# Patient Record
Sex: Male | Born: 1935 | Race: White | Hispanic: No | Smoking: Never smoker
Health system: Southern US, Community
[De-identification: ages and names within clinical notes are randomized; demographics above are authoritative.]

## PROBLEM LIST (undated history)

## (undated) DIAGNOSIS — I5022 Chronic systolic (congestive) heart failure: Secondary | ICD-10-CM

## (undated) DIAGNOSIS — K219 Gastro-esophageal reflux disease without esophagitis: Secondary | ICD-10-CM

## (undated) DIAGNOSIS — E7800 Pure hypercholesterolemia, unspecified: Secondary | ICD-10-CM

## (undated) DIAGNOSIS — I10 Essential (primary) hypertension: Secondary | ICD-10-CM

## (undated) DIAGNOSIS — Z8781 Personal history of (healed) traumatic fracture: Secondary | ICD-10-CM

## (undated) DIAGNOSIS — I429 Cardiomyopathy, unspecified: Secondary | ICD-10-CM

## (undated) HISTORY — PX: NO PAST SURGERIES: SHX2092

---

## 2012-04-08 ENCOUNTER — Emergency Department (HOSPITAL_COMMUNITY)
Admission: EM | Admit: 2012-04-08 | Discharge: 2012-04-08 | Disposition: A | Discharge status: Home / Self Care | Payer: Medicare Other | Attending: Emergency Medicine | Admitting: Emergency Medicine

## 2012-04-08 ENCOUNTER — Encounter (HOSPITAL_COMMUNITY): Payer: Self-pay | Admitting: *Deleted

## 2012-04-08 ENCOUNTER — Emergency Department (HOSPITAL_COMMUNITY): Payer: Medicare Other

## 2012-04-08 DIAGNOSIS — I509 Heart failure, unspecified: Secondary | ICD-10-CM | POA: Insufficient documentation

## 2012-04-08 DIAGNOSIS — I10 Essential (primary) hypertension: Secondary | ICD-10-CM | POA: Insufficient documentation

## 2012-04-08 DIAGNOSIS — N4889 Other specified disorders of penis: Secondary | ICD-10-CM | POA: Insufficient documentation

## 2012-04-08 DIAGNOSIS — Z87891 Personal history of nicotine dependence: Secondary | ICD-10-CM | POA: Insufficient documentation

## 2012-04-08 LAB — COMPREHENSIVE METABOLIC PANEL
ALT: 36 U/L (ref 0–53)
BUN: 22 mg/dL (ref 6–23)
CO2: 27 mEq/L (ref 19–32)
Calcium: 9.1 mg/dL (ref 8.4–10.5)
Creatinine, Ser: 1.15 mg/dL (ref 0.50–1.35)
GFR calc Af Amer: 63 mL/min — ABNORMAL LOW (ref >90)
GFR calc non Af Amer: 54 mL/min — ABNORMAL LOW (ref >90)
Glucose, Bld: 159 mg/dL — ABNORMAL HIGH (ref 70–99)

## 2012-04-08 LAB — CBC WITH DIFFERENTIAL/PLATELET
Eosinophils Relative: 3 % (ref 0–5)
HCT: 38.4 % — ABNORMAL LOW (ref 39.0–52.0)
Lymphocytes Relative: 17 % (ref 12–46)
Lymphs Abs: 1.1 10*3/uL (ref 0.7–4.0)
MCV: 99.5 fL (ref 78.0–100.0)
Monocytes Absolute: 0.6 10*3/uL (ref 0.1–1.0)
Monocytes Relative: 9 % (ref 3–12)
RBC: 3.86 MIL/uL — ABNORMAL LOW (ref 4.22–5.81)
WBC: 6.6 10*3/uL (ref 4.0–10.5)

## 2012-04-08 LAB — URINALYSIS, ROUTINE W REFLEX MICROSCOPIC
Bilirubin Urine: NEGATIVE
Glucose, UA: NEGATIVE mg/dL
Specific Gravity, Urine: 1.025 (ref 1.005–1.030)

## 2012-04-08 LAB — URINE MICROSCOPIC-ADD ON

## 2012-04-08 MED ORDER — FUROSEMIDE 40 MG PO TABS: 40.0000 mg | ORAL_TABLET | Freq: Every day | ORAL | Status: DC

## 2012-04-08 NOTE — Discharge Instructions (Signed)
The swelling on your penis is related to the generalized swelling you're having from heart failure. He need to stay on a low-salt diet and you have been given a prescription for a diuretic which is going to make you urinate a lot. Followup with your PCP in one week for reevaluation. Return to the emergency department if symptoms are getting worse  Heart Failure Heart failure (HF) is a condition in which the heart has trouble pumping blood. This means your heart does not pump blood efficiently for your body to work well. In some cases of HF, fluid may back up into your lungs or you may have swelling (edema) in your lower legs. HF is a long-term (chronic) condition. It is important for you to take good care of yourself and follow your caregiver's treatment plan. CAUSES   Health conditions:   High blood pressure (hypertension) causes the heart muscle to work harder than normal. When pressure in the blood vessels is high, the heart needs to pump (contract) with more force in order to circulate blood throughout the body. High blood pressure eventually causes the heart to become stiff and weak.   Coronary artery disease (CAD) is the buildup of cholesterol and fat (plaques) in the arteries of the heart. The blockage in the arteries deprives the heart muscle of oxygen and blood. This can cause chest pain and may lead to a heart attack. High blood pressure can also contribute to CAD.   Heart attack (myocardial infarction) occurs when 1 or more arteries in the heart become blocked. The loss of oxygen damages the muscle tissue of the heart. When this happens, part of the heart muscle dies. The injured tissue does not contract as well and weakens the heart's ability to pump blood.   Abnormal heart valves can cause HF when the heart valves do not open and close properly. This makes the heart muscle pump harder to keep the blood flowing.   Heart muscle disease (cardiomyopathy or myocarditis) is damage to the heart  muscle from a variety of causes. These can include drug or alcohol abuse, infections, or unknown reasons. These can increase the risk of HF.   Lung disease makes the heart work harder because the lungs do not work properly. This can cause a strain on the heart leading it to fail.   Diabetes increases the risk of HF. High blood sugar contributes to high fat (lipid) levels in the blood. Diabetes can also cause slow damage to tiny blood vessels that carry important nutrients to the heart muscle. When the heart does not get enough oxygen and food, it can cause the heart to become weak and stiff. This leads to a heart that does not contract efficiently.   Other diseases can contribute to HF. These include abnormal heart rhythms, thyroid problems, and low blood counts (anemia).   Unhealthy lifestyle habits:   Obesity.   Smoking.   Eating foods high in fat and cholesterol.   Eating or drinking beverages high in salt.   Drug or alcohol abuse.   Lack of exercise.  SYMPTOMS  HF symptoms may vary and can be hard to detect. Symptoms may include:  Shortness of breath with activity, such as climbing stairs.   Persistent cough.   Swelling of the feet, ankles, legs, or abdomen.   Unexplained weight gain.   Difficulty breathing when lying flat.   Waking from sleep because of the need to sit up and get more air.   Rapid heartbeat.  Fatigue and loss of energy.   Feeling lightheaded or close to fainting.  DIAGNOSIS  A diagnosis of HF is based on your history, symptoms, physical examination, and diagnostic tests. Diagnostic tests for HF may include:  EKG.   Chest X-ray.   Blood tests.   Exercise stress test.   Blood oxygen test (arterial blood gas).   Evaluation by a heart doctor (cardiologist).   Ultrasound evaluation of the heart (echocardiogram).   Heart artery test to look for blockages (angiogram).   Radioactive imaging to look at the heart (radionuclide test).    TREATMENT  Treatment is aimed at managing the symptoms of HF. Medicines, lifestyle changes, or surgical intervention may be necessary to treat HF.  Medicines to help treat HF may include:   Angiotensin-converting enzyme (ACE) inhibitors. These block the effects of a blood protein called angiotensin-converting enzyme. ACE inhibitors relax (dilate) the blood vessels and help lower blood pressure. This decreases the workload of the heart, slows the progression of HF, and improves symptoms.   Angiotensin receptor blockers (ARBs). These medications work similar to ACE inhibitors. ARBs may be an alternative for people who cannot tolerate an ACE inhibitor.   Aldosterone antagonists. This medication helps get rid of extra fluid from your body. This lowers the volume of blood the heart has to pump.   Water pills (diuretics). Diuretics cause the kidneys to remove salt and water from the blood. The extra fluid is removed by urination. By removing extra fluid from the body, diuretics help lower the workload of the heart and help prevent fluid buildup in the lungs so breathing is easier.   Beta blockers. These prevent the heart from beating too fast and improve heart muscle strength. Beta blockers help maintain a normal heart rate, control blood pressure, and improve HF symptoms.   Digitalis. This increases the force of the heartbeat and may be helpful to people with HF or heart rhythm problems.   Healthy lifestyle changes include:   Stopping smoking.   Eating a healthy diet. Avoid foods high in fat. Avoid foods fried in oil or made with fat. A dietician can help with healthy food choices.   Limiting how much salt you eat.   Limiting alcohol intake to no more than 1 drink per day for women and 2 drinks per day for men. Drinking more than that is harmful to your heart. If your heart has already been damaged by alcohol or you have severe HF, drinking alcohol should be stopped completely.   Exercising  as directed by your caregiver.   Surgical treatment for HF may include:   Procedures to open blocked arteries, repair damaged heart valves, or remove damaged heart muscle tissue.   A pacemaker to help heart muscle function and to control certain abnormal heart rhythms.   A defibrillator to possibly prevent sudden cardiac death.  HOME CARE INSTRUCTIONS   Activity level. Your caregiver can help you determine what type of exercise program may be helpful. It is important to maintain your strength. Pace your physical activity to avoid shortness of breath or chest pain. Rest for 1 hour before and after meals. A cardiac rehabilitation program may be helpful to some people with HF.   Diet. Eat a heart healthy diet. Food choices should be low in saturated fat and cholesterol. Talk to a dietician to learn about heart healthy foods.   Salt intake. When you have HF, you need to limit the amount of salt you eat. Eat less than 1500  milligrams (mg) of salt per day or as recommended by your caregiver.   Weight monitoring. Weigh yourself every day. You should weigh yourself in the morning after you urinate and before you eat breakfast. Wear the same amount of clothing each time you weigh yourself. Record your weight daily. Bring your recorded weights to your clinic visits. Tell your caregiver right away if you have gained 3 lb/1.4 kg in 1 day, or 5 lb/2.3 kg in a week or whatever amount you were told to report.   Blood pressure monitoring. This should be done as directed by your caregiver. A home blood pressure cuff can be purchased at a drugstore. Record your blood pressure numbers and bring them to your clinic visits. Tell your caregiver if you become dizzy or lightheaded upon standing up.   Smoking. If you are currently a smoker, it is time to quit. Nicotine makes your heart work harder by causing your blood vessels to constrict. Do not use nicotine gum or patches before talking to your caregiver.   Follow  up. Be sure to schedule a follow-up visit with your caregiver. Keep all your appointments.  SEEK MEDICAL CARE IF:   Your weight increases by 3 lb/1.4 kg in 1 day or 5 lb/2.3 kg in a week.   You notice increasing shortness of breath that is unusual for you. This may happen during rest, sleep, or with activity.   You cough more than normal, especially with physical activity.   You notice more swelling in your hands, feet, ankles, or belly (abdomen).   You are unable to sleep because it is hard to breathe.   You cough up bloody mucus (sputum).   You begin to feel "jumping" or "fluttering" sensations (palpitations) in your chest.  SEEK IMMEDIATE MEDICAL CARE IF:   You have severe chest pain or pressure which may include symptoms such as:   Pain or pressure in the arms, neck, jaw, or back.   Feeling sweaty.   Feeling sick to your stomach (nauseous).   Feeling short of breath while at rest.   Having a fast or irregular heartbeat.   You experience stroke symptoms. These symptoms include:   Facial weakness or numbness.   Weakness or numbness in an arm, leg, or on one side of your body.   Blurred vision.   Difficulty talking or thinking.   Dizziness or fainting.   Severe headache.  THESE ARE MEDICAL EMERGENCIES. Do not wait to see if the symptoms go away. Call your local emergency services (911 in [place] DO NOT drive yourself to the hospital. IMPORTANT  Make a list of every medicine, vitamin, or herbal supplement you are taking. Keep the list with you at all times. Show it to your caregiver at every visit. Keep the list up-to-date.   Ask your caregiver or pharmacist to write an explanation of each medicine you are taking. This should include:   Why you are taking it.   The possible side effects.   The best time of day to take it.   Foods to take with it or what foods to avoid.   When to stop taking it.  MAKE SURE YOU:   Understand these instructions.   Will watch  your condition.   Will get help right away if you are not doing well or get worse.  Document Released: 09/24/2005 Document Revised: 09/13/2011 Document Reviewed: 01/06/2010 [facility] Patient Information 2012 [place], [place].  Sodium-Controlled Diet Sodium is a mineral. It is found in many  foods. Sodium may be found naturally or added during the making of a food. The most common form of sodium is salt, which is made up of sodium and chloride. Reducing your sodium intake involves changing your eating habits. The following guidelines will help you reduce the sodium in your diet:  Stop using the salt shaker.   Use salt sparingly in cooking and baking.   Substitute with sodium-free seasonings and spices.   Do not use a salt substitute (potassium chloride) without your caregiver's permission.   Include a variety of fresh, unprocessed foods in your diet.   Limit the use of processed and convenience foods that are high in sodium.  USE THE FOLLOWING FOODS SPARINGLY: Breads/Starches  Commercial bread stuffing, commercial pancake or waffle mixes, coating mixes. Waffles. Croutons. Prepared (boxed or frozen) potato, rice, or noodle mixes that contain salt or sodium. Salted [place] fries or hash browns. Salted popcorn, breads, crackers, chips, or snack foods.  Vegetables  Vegetables canned with salt or prepared in cream, butter, or cheese sauces. Sauerkraut. Tomato or vegetable juices canned with salt.   Fresh vegetables are allowed if rinsed thoroughly.  Fruit  Fruit is okay to eat.  Meat and Meat Substitutes  Salted or smoked meats, such as bacon or Canadian bacon, chipped or corned beef, hot dogs, salt pork, luncheon meats, pastrami, ham, or sausage. Canned or smoked fish, poultry, or meat. Processed cheese or cheese spreads, blue or Roquef[place] cheese. Battered or frozen fish products. Prepared spaghetti sauce. Baked beans. Reuben sandwiches. Salted nuts. Caviar.  Milk  Limit buttermilk to 1  cup per week.  Soups and Combination Foods  Bouillon cubes, canned or dried soups, broth, consomm. Convenience (frozen or packaged) dinners with more than 600 mg sodium. Pot pies, pizza, Asian food, fast food cheeseburgers, and specialty sandwiches.  Desserts and Sweets  Regular (salted) desserts, pie, commercial fruit snack pies, commercial snack cakes, canned puddings.   Eat desserts and sweets in moderation.  Fats and Oils  Gravy mixes or canned gravy. No more than 1 to 2 tbs of salad dressing. Chip dips.   Eat fats and oils in moderation.  Beverages  See those listed under the vegetables and milk groups.  Condiments  Ketchup, mustard, meat sauces, salsa, regular (salted) and lite soy sauce or mustard. Dill pickles, olives, meat tenderizer. Prepared horseradish or pickle relish. Dutch-processed cocoa. Baking powder or baking soda used medicinally. Worcestershire sauce. "Light" salt. Salt substitute, unless approved by your caregiver.  Document Released: 03/16/2002 Document Revised: 09/13/2011 Document Reviewed: 10/17/2009 [facility] Patient Information 2012 [place], [place].  Furosemide tablets What is this medicine? FUROSEMIDE (fyoor [place] se mide) is a diuretic. It helps you make more urine and to lose salt and excess water from your body. This medicine is used to treat high blood pressure, and edema or swelling from heart, kidney, or liver disease. This medicine may be used for other purposes; ask your health care provider or pharmacist if you have questions. What should I tell my health care provider before I take this medicine? They need to know if you have any of these conditions: -abnormal blood electrolytes -diarrhea or vomiting -gout -heart disease -kidney disease, small amounts of urine, or difficulty passing urine -liver disease -an unusual or allergic reaction to furosemide, sulfa drugs, other medicines, foods, dyes, or preservatives -pregnant or trying to get  pregnant -breast-feeding How should I use this medicine? Take this medicine by mouth with a glass of water. Follow the directions on the prescription  label. You may take this medicine with or without food. If it upsets your stomach, take it with food or milk. Do not take your medicine more often than directed. Remember that you will need to pass more urine after taking this medicine. Do not take your medicine at a time of day that will cause you problems. Do not take at bedtime. Talk to your pediatrician regarding the use of this medicine in children. While this drug may be prescribed for selected conditions, precautions do apply. Overdosage: If you think you have taken too much of this medicine contact a poison control center or emergency room at once. NOTE: This medicine is only for you. Do not share this medicine with others. What if I miss a dose? If you miss a dose, take it as soon as you can. If it is almost time for your next dose, take only that dose. Do not take double or extra doses. What may interact with this medicine? -aspirin and aspirin-like medicines -certain antibiotics -chloral hydrate -cisplatin -cyclosporine -digoxin -diuretics -laxatives -lithium -medicines for blood pressure -medicines that relax muscles for surgery -methotrexate -NSAIDs, medicines for pain and inflammation like ibuprofen, naproxen, or indomethacin -phenytoin -steroid medicines like prednisone or cortisone -sucralfate This list may not describe all possible interactions. Give your health care provider a list of all the medicines, herbs, non-prescription drugs, or dietary supplements you use. Also tell them if you smoke, drink alcohol, or use illegal drugs. Some items may interact with your medicine. What should I watch for while using this medicine? Visit your doctor or health care professional for regular checks on your progress. Check your blood pressure regularly. Ask your doctor or health care  professional what your blood pressure should be, and when you should contact him or her. If you are a diabetic, check your blood sugar as directed. You may need to be on a special diet while taking this medicine. Check with your doctor. Also, ask how many glasses of fluid you need to drink a day. You must not get dehydrated. You may get drowsy or dizzy. Do not drive, use machinery, or do anything that needs mental alertness until you know how this drug affects you. Do not stand or sit up quickly, especially if you are an older patient. This reduces the risk of dizzy or fainting spells. Alcohol can make you more drowsy and dizzy. Avoid alcoholic drinks. This medicine can make you more sensitive to the sun. Keep out of the sun. If you cannot avoid being in the sun, wear protective clothing and use sunscreen. Do not use sun lamps or tanning beds/booths. What side effects may I notice from receiving this medicine? Side effects that you should report to your doctor or health care professional as soon as possible: -blood in urine or stools -dry mouth -fever or chills -hearing loss or ringing in the ears -irregular heartbeat -muscle pain or weakness, cramps -skin rash -stomach upset, pain, or nausea -tingling or numbness in the hands or feet -unusually weak or tired -vomiting or diarrhea -yellowing of the eyes or skin Side effects that usually do not require medical attention (report to your doctor or health care professional if they continue or are bothersome): -headache -loss of appetite -unusual bleeding or bruising This list may not describe all possible side effects. Call your doctor for medical advice about side effects. You may report side effects to FDA at 1-800-FDA-1088. Where should I keep my medicine? Keep out of the reach of children. Store  at room temperature between 15 and 30 degrees C (59 and 86 degrees F). Protect from light. Throw away any unused medicine after the expiration  date. NOTE: This sheet is a summary. It may not cover all possible information. If you have questions about this medicine, talk to your doctor, pharmacist, or health care provider.  2012, [employer]. (09/12/2009 4:24:50 PM)

## 2012-04-08 NOTE — ED Provider Notes (Signed)
History  This chart was scribed for Dione Booze, MD by Bennett Scrape. This patient was seen in room APA08/APA08 and the patient's care was started at 9:47AM.  CSN: [id number]  Arrival date & time 04/08/12  1308   First MD Initiated Contact with Patient 04/08/12 [phone]      Chief Complaint  Patient presents with  . Groin Swelling     The history is provided by the patient. No language interpreter was used.     Calvin Rivas is a 78 y.o. male who presents to the Emergency Department complaining of groin swelling. Onset Saturday morning, with difficulty urinating due to swelling in the penis with associated increased bilateral foot swelling. Pt states that when he moves his foreskin back, he is able to urinate without difficulty. Pt is here for evaluation for fear of penile cancer. Pt has been experiencing swelling on and off since March, has been on his feet a lot, and has not been getting a lot of rest. Pt also denies nausea, vomiting, fever, chills, SOB, and orthopnea and any prior episodes of similar symptoms. He has a h/o HTN but denies having a h/o heart related problems. Pt is currently taking blood pressure medication, calcium, and a multivitamin. He is a former smoker but denies alcohol use.  PCP is Dr. Brent Bulla in [place]  Past Medical History  Diagnosis Date  . Hypertension     History reviewed. No pertinent past surgical history.  History reviewed. No pertinent family history.  History  Substance Use Topics  . Smoking status: Former Games developer  . Smokeless tobacco: Not on file  . Alcohol Use: No      Review of Systems  Constitutional: Negative for fever and chills.  Respiratory: Negative for cough and shortness of breath.   Cardiovascular: Positive for leg swelling (increased). Negative for chest pain.  Genitourinary: Positive for penile swelling. Negative for dysuria and hematuria.  All other systems reviewed and are negative.     Allergies   Penicillins  Home Medications  No current outpatient prescriptions on file.  Triage Vitals: BP 158/66  Pulse 85  Temp 97.5 F (36.4 C) (Oral)  Resp 20  Ht 5\' 7"  (1.702 m)  Wt 140 lb (63.504 kg)  BMI 21.93 kg/m2  SpO2 99%  Physical Exam  Nursing note and vitals reviewed. Constitutional: He is oriented to person, place, and time. He appears well-developed and well-nourished. No distress.  HENT:  Head: Normocephalic and atraumatic.  Eyes: EOM are normal.  Neck: Neck supple. No tracheal deviation present.  Cardiovascular: Normal rate.   Pulmonary/Chest: Effort normal. No respiratory distress. He has rales (at both lung bases).  Abdominal: Hernia confirmed negative in the right inguinal area and confirmed negative in the left inguinal area.  Genitourinary: Uncircumcised.       Edema of the foreskin, no inguinal hernia noted, urethra is normal  Musculoskeletal: Normal range of motion. He exhibits edema (2-3+ pedal and pretibial edem, 2+ presacral edema).  Neurological: He is alert and oriented to person, place, and time.  Skin: Skin is warm and dry.  Psychiatric: He has a normal mood and affect. His behavior is normal.    ED Course  Procedures (including critical care time)  DIAGNOSTIC STUDIES: Oxygen Saturation is 99% on room air, normal by my interpretation.    COORDINATION OF CARE: 9:53AM-Discussed check for CHF and obtaining a urine sample, blood test, chest x-ray, urine and EKG with pt and pt agreed to plan.  Results for orders placed  during the hospital encounter of 04/08/12  URINALYSIS, ROUTINE W REFLEX MICROSCOPIC      Component Value Range   Color, Urine YELLOW  YELLOW   APPearance CLEAR  CLEAR   Specific Gravity, Urine 1.025  1.005 - 1.030   pH 5.5  5.0 - 8.0   Glucose, UA NEGATIVE  NEGATIVE mg/dL   Hgb urine dipstick TRACE (*) NEGATIVE   Bilirubin Urine NEGATIVE  NEGATIVE   Ketones, ur NEGATIVE  NEGATIVE mg/dL   Protein, ur TRACE (*) NEGATIVE mg/dL    Urobilinogen, UA 0.2  0.0 - 1.0 mg/dL   Nitrite NEGATIVE  NEGATIVE   Leukocytes, UA NEGATIVE  NEGATIVE  CBC WITH DIFFERENTIAL      Component Value Range   WBC 6.6  4.0 - 10.5 K/uL   RBC 3.86 (*) 4.22 - 5.81 MIL/uL   Hemoglobin 12.2 (*) 13.0 - 17.0 g/dL   HCT 16.1 (*) 09.6 - 04.5 %   MCV 99.5  78.0 - 100.0 fL   MCH 31.6  26.0 - 34.0 pg   MCHC 31.8  30.0 - 36.0 g/dL   RDW 40.9  81.1 - 91.4 %   Platelets 172  150 - 400 K/uL   Neutrophils Relative 71  43 - 77 %   Neutro Abs 4.7  1.7 - 7.7 K/uL   Lymphocytes Relative 17  12 - 46 %   Lymphs Abs 1.1  0.7 - 4.0 K/uL   Monocytes Relative 9  3 - 12 %   Monocytes Absolute 0.6  0.1 - 1.0 K/uL   Eosinophils Relative 3  0 - 5 %   Eosinophils Absolute 0.2  0.0 - 0.7 K/uL   Basophils Relative 1  0 - 1 %   Basophils Absolute 0.0  0.0 - 0.1 K/uL  COMPREHENSIVE METABOLIC PANEL      Component Value Range   Sodium 140  135 - 145 mEq/L   Potassium 4.4  3.5 - 5.1 mEq/L   Chloride 103  96 - 112 mEq/L   CO2 27  19 - 32 mEq/L   Glucose, Bld 159 (*) 70 - 99 mg/dL   BUN 22  6 - 23 mg/dL   Creatinine, Ser 7.82  0.50 - 1.35 mg/dL   Calcium 9.1  8.4 - 95.6 mg/dL   Total Protein 7.2  6.0 - 8.3 g/dL   Albumin 3.3 (*) 3.5 - 5.2 g/dL   AST 45 (*) 0 - 37 U/L   ALT 36  0 - 53 U/L   Alkaline Phosphatase 135 (*) 39 - 117 U/L   Total Bilirubin 0.6  0.3 - 1.2 mg/dL   GFR calc non Af Amer 54 (*) >90 mL/min   GFR calc Af Amer 63 (*) >90 mL/min  TROPONIN I      Component Value Range   Troponin I <0.30  <0.30 ng/mL  URINE MICROSCOPIC-ADD ON      Component Value Range   RBC / HPF 0-2  <3 RBC/hpf   Dg Chest 2 View  04/08/2012  *RADIOLOGY REPORT*  Clinical Data: Edema/swelling involving the groin.  CHEST - 2 VIEW 04/08/2012:  Comparison: None.  Findings: Cardiac silhouette markedly enlarged.  Diffuse interstitial pulmonary opacities.  No localized airspace consolidation.  No pleural effusions.  Thoracic aorta atherosclerotic.  Hilar and mediastinal contours  otherwise unremarkable.  Degenerative changes involving the thoracic spine. Generalized osteopenia.  IMPRESSION: Mild CHF suspected, with marked cardiomegaly and diffuse interstitial pulmonary edema.  This may be superimposed upon interstitial fibrosis,  but I have no prior examinations for comparison.  Original Report Authenticated By: Arnell Sieving, M.D.   Date: 04/08/2012  Rate: 78  Rhythm: normal sinus rhythm  QRS Axis: normal  Intervals: normal  ST/T Wave abnormalities: nonspecific T wave changes  Conduction Disutrbances:none  Narrative Interpretation: Low voltage, nonspecific T wave flattening diffusely. No prior ECG available for comparison.  Old EKG Reviewed: none available    1. CHF (congestive heart failure)   2. Penile edema       MDM  Significant edema including edema of the penis and the foreskin. There is a localized area of increased swelling which is situated near the urethra which is apparently what is causing his problems with urinating. I reviewed his medications and he is not on any kind of diuretic. I am concerned that he has developed significant heart failure as evidenced by rales and edema. Chest x-ray and ECG will be obtained as well as laboratory workup. If no significant findings on laboratory workup, we'll he will be treated with diuretics and followup with his PCP.       I personally performed the services described in this documentation, which was scribed in my presence. The recorded information has been reviewed and considered.      Dione Booze, MD 04/08/12 1158

## 2012-04-08 NOTE — ED Notes (Addendum)
Pt c/o swelling to the end of his penis since Saturday. States that he has to pull the skin back to urinate. Pt also c/o swelling in bilateral lower extremities.

## 2012-11-15 ENCOUNTER — Encounter (HOSPITAL_COMMUNITY): Payer: Self-pay

## 2012-11-15 ENCOUNTER — Emergency Department (HOSPITAL_COMMUNITY)
Admission: EM | Admit: 2012-11-15 | Discharge: 2012-11-15 | Disposition: A | Discharge status: Home / Self Care | Payer: Medicare Other | Attending: Emergency Medicine | Admitting: Emergency Medicine

## 2012-11-15 DIAGNOSIS — Y92009 Unspecified place in unspecified non-institutional (private) residence as the place of occurrence of the external cause: Secondary | ICD-10-CM | POA: Insufficient documentation

## 2012-11-15 DIAGNOSIS — Z79899 Other long term (current) drug therapy: Secondary | ICD-10-CM | POA: Insufficient documentation

## 2012-11-15 DIAGNOSIS — S51811A Laceration without foreign body of right forearm, initial encounter: Secondary | ICD-10-CM

## 2012-11-15 DIAGNOSIS — Y9389 Activity, other specified: Secondary | ICD-10-CM | POA: Insufficient documentation

## 2012-11-15 DIAGNOSIS — I10 Essential (primary) hypertension: Secondary | ICD-10-CM | POA: Insufficient documentation

## 2012-11-15 DIAGNOSIS — S51809A Unspecified open wound of unspecified forearm, initial encounter: Secondary | ICD-10-CM | POA: Insufficient documentation

## 2012-11-15 DIAGNOSIS — Z87891 Personal history of nicotine dependence: Secondary | ICD-10-CM | POA: Insufficient documentation

## 2012-11-15 DIAGNOSIS — S0083XA Contusion of other part of head, initial encounter: Secondary | ICD-10-CM | POA: Insufficient documentation

## 2012-11-15 DIAGNOSIS — W010XXA Fall on same level from slipping, tripping and stumbling without subsequent striking against object, initial encounter: Secondary | ICD-10-CM | POA: Insufficient documentation

## 2012-11-15 DIAGNOSIS — Z7982 Long term (current) use of aspirin: Secondary | ICD-10-CM | POA: Insufficient documentation

## 2012-11-15 DIAGNOSIS — S0003XA Contusion of scalp, initial encounter: Secondary | ICD-10-CM | POA: Insufficient documentation

## 2012-11-15 NOTE — ED Notes (Signed)
Patient states he is getting hungry and tired of waiting wants to know where the MD is. Patient was informed that the MD would be in as soon as possible.

## 2012-11-15 NOTE — ED Notes (Signed)
Pt states that he got up this am around 3 to lock his backdoor, had his bedroom slippers on and one halfway came off causing him to fall, pt fell against a hutch, has skin tears to right arm, abrasions to left arm, bruising noted to left forehead area, pt states that he did hit his head against the hutch, denies any loc.

## 2012-11-15 NOTE — ED Provider Notes (Signed)
History    This chart was scribed for Carleene Cooper III, MD by Charolett Bumpers, ED Scribe. The patient was seen in room APA15/APA15. Patient's care was started at 1134.   CSN: [id number]  Arrival date & time 11/15/12  0944   First MD Initiated Contact with Patient 11/15/12 1134      Chief Complaint  Patient presents with  . Fall    The history is provided by the patient. No language interpreter was used.   Calvin Rivas is a 79 y.o. male who presents to the Emergency Department complaining of un witnessed, mechanical fall that occurred at 3 am this morning. He states that he fell this morning after tripping when his foot came out of his shoe. He states that he injured his bilateral arms and hit the left side of his head. He denies LOC or headache. He has skin tears to both arms but denies any pain or trouble with ROM. He takes an aspirin daily.   Past Medical History  Diagnosis Date  . Hypertension     History reviewed. No pertinent past surgical history.  No family history on file.  History  Substance Use Topics  . Smoking status: Former Games developer  . Smokeless tobacco: Not on file  . Alcohol Use: No      Review of Systems  Skin: Positive for wound.  Neurological: Negative for syncope and headaches.  All other systems reviewed and are negative.    Allergies  Penicillins  Home Medications   Current Outpatient Rx  Name  Route  Sig  Dispense  Refill  . aspirin 81 MG chewable tablet   Oral   Chew 81 mg by mouth daily.         . Calcium 600-200 MG-UNIT per tablet   Oral   Take 1 tablet by mouth daily.         . calcium carbonate (TUMS) 500 MG chewable tablet   Oral   Chew 1 tablet by mouth daily.         . Fe Fum-FA-B Cmp-C-Zn-Mg-Mn-Cu (HEMATINIC PLUS VIT/MINERALS) 106-1 MG TABS   Oral   Take 1 tablet by mouth daily.         Marland Kitchen losartan (COZAAR) 50 MG tablet   Oral   Take 50 mg by mouth daily.         . simvastatin (ZOCOR) 40 MG tablet  Oral   Take 40 mg by mouth every evening.         Marland Kitchen spironolactone (ALDACTONE) 25 MG tablet   Oral   Take 25 mg by mouth daily.           BP 150/63  Pulse 78  Temp(Src) 97.6 F (36.4 C) (Oral)  Resp 20  SpO2 100%  Physical Exam  Nursing note and vitals reviewed. Constitutional: He is oriented to person, place, and time. He appears well-developed and well-nourished. No distress.  HENT:  Head: Normocephalic. Head is with contusion.  Contusion to the left forehead.   Eyes: EOM are normal. Pupils are equal, round, and reactive to light.  Neck: Normal range of motion. Neck supple. No tracheal deviation present.  Cardiovascular: Normal rate, regular rhythm and normal heart sounds.   Pulmonary/Chest: Effort normal and breath sounds normal. No respiratory distress. He has no wheezes. He has no rhonchi. He has no rales.  Abdominal: Soft. He exhibits no distension.  Musculoskeletal: Normal range of motion. He exhibits no edema.  Neurological: He is alert and oriented to  person, place, and time.  Skin: Skin is warm and dry.  Skin tears to the right forearm.   Psychiatric: He has a normal mood and affect. His behavior is normal.    ED Course  LACERATION REPAIR Date/Time: 11/15/2012 1:15 PM Performed by: Osvaldo Human Authorized by: Osvaldo Human Consent: Verbal consent obtained. Risks and benefits: risks, benefits and alternatives were discussed Consent given by: patient Patient understanding: patient states understanding of the procedure being performed Site marked: the operative site was not marked Patient identity confirmed: verbally with patient Laceration length: 3 cm Foreign bodies: no foreign bodies Tendon involvement: complex Nerve involvement: complex Vascular damage: no Anesthesia method: None. Patient sedated: no Preparation: Patient was prepped and draped in the usual sterile fashion. Irrigation solution: saline Irrigation method: tap Amount of  cleaning: standard Debridement: none Degree of undermining: none Skin closure: Steri-Strips Approximation: loose Approximation difficulty: simple Patient tolerance: Patient tolerated the procedure well with no immediate complications. Comments: Wound washed, and skin tear edges approximated, and then steristripped in place.  4 x 4 and Kling dressing applied.    Pt had a second skin tear, 3 cm long.  This laceration was repaired as described above.  DIAGNOSTIC STUDIES: Oxygen Saturation is 100% on room air, normal by my interpretation.    COORDINATION OF CARE:  12:13-Discussed planned course of treatment with the patient including cleaning and treating his skin tears, who is agreeable at this time.   1:18 PM Pt to remove dressing in 2 days, and to let steristrips fall off, which should occur in about a week.     1. Skin tear of forearm without complication, right, initial encounter   2. Contusion of forehead     I personally performed the services described in this documentation, which was scribed in my presence. The recorded information has been reviewed and is accurate. Osvaldo Human, MD       Carleene Cooper III, MD 11/15/12 1344

## 2012-11-15 NOTE — ED Notes (Signed)
Pt tripped and fell, last night, injured bil arms and hit side of head. Denies loc. Skin tears to both arms.

## 2013-06-08 DEATH — deceased

## 2013-08-24 ENCOUNTER — Encounter (HOSPITAL_COMMUNITY): Payer: Self-pay | Admitting: Emergency Medicine

## 2013-08-24 ENCOUNTER — Inpatient Hospital Stay (HOSPITAL_COMMUNITY)
Admission: EM | Admit: 2013-08-24 | Discharge: 2013-08-28 | DRG: 183 | Disposition: A | Discharge status: Home / Self Care | Payer: Medicare Other | Attending: General Surgery | Admitting: General Surgery

## 2013-08-24 ENCOUNTER — Emergency Department (HOSPITAL_COMMUNITY): Payer: Medicare Other

## 2013-08-24 DIAGNOSIS — Z7982 Long term (current) use of aspirin: Secondary | ICD-10-CM

## 2013-08-24 DIAGNOSIS — E875 Hyperkalemia: Secondary | ICD-10-CM | POA: Diagnosis not present

## 2013-08-24 DIAGNOSIS — S2249XA Multiple fractures of ribs, unspecified side, initial encounter for closed fracture: Principal | ICD-10-CM | POA: Diagnosis present

## 2013-08-24 DIAGNOSIS — I129 Hypertensive chronic kidney disease with stage 1 through stage 4 chronic kidney disease, or unspecified chronic kidney disease: Secondary | ICD-10-CM | POA: Diagnosis present

## 2013-08-24 DIAGNOSIS — S2232XA Fracture of one rib, left side, initial encounter for closed fracture: Secondary | ICD-10-CM

## 2013-08-24 DIAGNOSIS — D62 Acute posthemorrhagic anemia: Secondary | ICD-10-CM | POA: Diagnosis present

## 2013-08-24 DIAGNOSIS — W19XXXA Unspecified fall, initial encounter: Secondary | ICD-10-CM

## 2013-08-24 DIAGNOSIS — E7800 Pure hypercholesterolemia, unspecified: Secondary | ICD-10-CM | POA: Diagnosis present

## 2013-08-24 DIAGNOSIS — N179 Acute kidney failure, unspecified: Secondary | ICD-10-CM | POA: Diagnosis present

## 2013-08-24 DIAGNOSIS — IMO0002 Reserved for concepts with insufficient information to code with codable children: Secondary | ICD-10-CM

## 2013-08-24 DIAGNOSIS — S066XAA Traumatic subarachnoid hemorrhage with loss of consciousness status unknown, initial encounter: Secondary | ICD-10-CM

## 2013-08-24 DIAGNOSIS — S2242XA Multiple fractures of ribs, left side, initial encounter for closed fracture: Secondary | ICD-10-CM

## 2013-08-24 DIAGNOSIS — N184 Chronic kidney disease, stage 4 (severe): Secondary | ICD-10-CM | POA: Diagnosis present

## 2013-08-24 DIAGNOSIS — I619 Nontraumatic intracerebral hemorrhage, unspecified: Secondary | ICD-10-CM | POA: Diagnosis present

## 2013-08-24 DIAGNOSIS — W100XXA Fall (on)(from) escalator, initial encounter: Secondary | ICD-10-CM | POA: Diagnosis present

## 2013-08-24 DIAGNOSIS — S069X9A Unspecified intracranial injury with loss of consciousness of unspecified duration, initial encounter: Secondary | ICD-10-CM

## 2013-08-24 DIAGNOSIS — S06369A Traumatic hemorrhage of cerebrum, unspecified, with loss of consciousness of unspecified duration, initial encounter: Secondary | ICD-10-CM

## 2013-08-24 DIAGNOSIS — Z880 Allergy status to penicillin: Secondary | ICD-10-CM

## 2013-08-24 DIAGNOSIS — E860 Dehydration: Secondary | ICD-10-CM

## 2013-08-24 DIAGNOSIS — K219 Gastro-esophageal reflux disease without esophagitis: Secondary | ICD-10-CM | POA: Diagnosis present

## 2013-08-24 DIAGNOSIS — S066X9A Traumatic subarachnoid hemorrhage with loss of consciousness of unspecified duration, initial encounter: Secondary | ICD-10-CM

## 2013-08-24 DIAGNOSIS — IMO0001 Reserved for inherently not codable concepts without codable children: Secondary | ICD-10-CM

## 2013-08-24 HISTORY — DX: Pure hypercholesterolemia, unspecified: E78.00

## 2013-08-24 LAB — BASIC METABOLIC PANEL
CO2: 27 mEq/L (ref 19–32)
Calcium: 9.4 mg/dL (ref 8.4–10.5)
GFR calc Af Amer: 46 mL/min — ABNORMAL LOW (ref >90)
GFR calc non Af Amer: 40 mL/min — ABNORMAL LOW (ref >90)
Sodium: 142 mEq/L (ref 135–145)

## 2013-08-24 LAB — CBC WITH DIFFERENTIAL/PLATELET
Basophils Absolute: 0 10*3/uL (ref 0.0–0.1)
Eosinophils Relative: 2 % (ref 0–5)
Lymphocytes Relative: 9 % — ABNORMAL LOW (ref 12–46)
MCV: 101.1 fL — ABNORMAL HIGH (ref 78.0–100.0)
Platelets: 135 10*3/uL — ABNORMAL LOW (ref 150–400)
RDW: 14.7 % (ref 11.5–15.5)
WBC: 11.2 10*3/uL — ABNORMAL HIGH (ref 4.0–10.5)

## 2013-08-24 LAB — PROTIME-INR: INR: 1.12 (ref 0.00–1.49)

## 2013-08-24 LAB — APTT: aPTT: 33 seconds (ref 24–37)

## 2013-08-24 MED ORDER — ONDANSETRON HCL 4 MG PO TABS: 4.0000 mg | ORAL_TABLET | Freq: Four times a day (QID) | ORAL | Status: DC | PRN

## 2013-08-24 MED ORDER — DOXYCYCLINE HYCLATE 100 MG PO TABS
100.0000 mg | ORAL_TABLET | Freq: Two times a day (BID) | ORAL | Status: AC
  Administered 2013-08-24 – 2013-08-28 (×8): 100 mg via ORAL
  Filled 2013-08-24 (×9): qty 1

## 2013-08-24 MED ORDER — SIMVASTATIN 40 MG PO TABS
40.0000 mg | ORAL_TABLET | Freq: Every evening | ORAL | Status: DC
  Administered 2013-08-24 – 2013-08-27 (×3): 40 mg via ORAL
  Filled 2013-08-24 (×6): qty 1

## 2013-08-24 MED ORDER — POLYETHYLENE GLYCOL 3350 17 G PO PACK
17.0000 g | PACK | Freq: Every day | ORAL | Status: DC
  Administered 2013-08-25 – 2013-08-28 (×4): 17 g via ORAL
  Filled 2013-08-24 (×5): qty 1

## 2013-08-24 MED ORDER — ONDANSETRON HCL 4 MG/2ML IJ SOLN
4.0000 mg | Freq: Four times a day (QID) | INTRAMUSCULAR | Status: DC | PRN
  Administered 2013-08-25: 4 mg via INTRAVENOUS
  Filled 2013-08-24: qty 2

## 2013-08-24 MED ORDER — POTASSIUM CHLORIDE IN NACL 20-0.45 MEQ/L-% IV SOLN
INTRAVENOUS | Status: DC
  Administered 2013-08-24 – 2013-08-25 (×2): via INTRAVENOUS
  Administered 2013-08-25: 50 mL/h via INTRAVENOUS
  Administered 2013-08-25: 50 mL via INTRAVENOUS
  Filled 2013-08-24 (×5): qty 1000

## 2013-08-24 MED ORDER — LOSARTAN POTASSIUM 50 MG PO TABS
50.0000 mg | ORAL_TABLET | Freq: Every day | ORAL | Status: DC
  Administered 2013-08-24 – 2013-08-27 (×4): 50 mg via ORAL
  Filled 2013-08-24 (×4): qty 1

## 2013-08-24 MED ORDER — DOCUSATE SODIUM 100 MG PO CAPS
100.0000 mg | ORAL_CAPSULE | Freq: Two times a day (BID) | ORAL | Status: DC
  Administered 2013-08-24 – 2013-08-28 (×8): 100 mg via ORAL
  Filled 2013-08-24 (×6): qty 1

## 2013-08-24 MED ORDER — TRAMADOL HCL 50 MG PO TABS
50.0000 mg | ORAL_TABLET | Freq: Four times a day (QID) | ORAL | Status: DC | PRN
  Administered 2013-08-24 – 2013-08-26 (×4): 50 mg via ORAL
  Filled 2013-08-24 (×4): qty 1

## 2013-08-24 MED ORDER — SPIRONOLACTONE 25 MG PO TABS
25.0000 mg | ORAL_TABLET | Freq: Every day | ORAL | Status: DC
  Administered 2013-08-24 – 2013-08-27 (×4): 25 mg via ORAL
  Filled 2013-08-24 (×4): qty 1

## 2013-08-24 MED ORDER — MORPHINE SULFATE 2 MG/ML IJ SOLN: 2.0000 mg | INTRAMUSCULAR | Status: DC | PRN

## 2013-08-24 MED ORDER — FENTANYL CITRATE 0.05 MG/ML IJ SOLN
50.0000 ug | Freq: Once | INTRAMUSCULAR | Status: AC
  Administered 2013-08-24: 50 ug via INTRAVENOUS
  Filled 2013-08-24: qty 2

## 2013-08-24 NOTE — ED Notes (Signed)
Report given to carelink, [facility]

## 2013-08-24 NOTE — ED Provider Notes (Signed)
CSN: [id number]     Arrival date & time 08/24/13  1047 History  This chart was scribed for Joya Gaskins, MD by Bennett Scrape, ED Scribe. This patient was seen in room APA09/APA09 and the patient's care was started at 11:21 AM.   Chief Complaint  Patient presents with  . Fall    Patient is a 79 y.o. male presenting with chest pain. The history is provided by the patient. No language interpreter was used.  Chest Pain Pain location:  L lateral chest (left ribs) Pain radiates to:  Does not radiate Duration: this morning. Timing:  Constant Progression:  Unchanged Chronicity:  New Worsened by:  Deep breathing and movement Associated symptoms: no abdominal pain, no back pain and no headache     HPI Comments: Calvin Rivas is a 79 y.o. male who presents to the Emergency Department complaining of rib pain with associated bilateral lower extremity swelling that started this morning. The rib pain is worse with deep breathing and movement. Pt states that he fell backwards while trying to get on an escalator 2 days ago. He hit his head and was seen at [facility] for the same after the incident. He had a negative work up including CXR. TD vaccine was updated during that visit. He denies any HA, dizziness, visual changes, back pain, neck pain or syncope. He denies any h/o CHF or other cardiac related conditions.  No LOC reported from fall.     Past Medical History  Diagnosis Date  . Hypertension   . Hypercholesterolemia   . Reflux    History reviewed. No pertinent past surgical history. No family history on file. History  Substance Use Topics  . Smoking status: Never Smoker   . Smokeless tobacco: Not on file  . Alcohol Use: No    Review of Systems  Eyes: Negative for visual disturbance.  Cardiovascular: Positive for chest pain (left rib pain).  Gastrointestinal: Negative for abdominal pain.  Musculoskeletal: Negative for back pain and neck pain.  Neurological: Negative for  syncope and headaches.  All other systems reviewed and are negative.    Allergies  Penicillins  Home Medications   Current Outpatient Rx  Name  Route  Sig  Dispense  Refill  . aspirin 81 MG chewable tablet   Oral   Chew 81 mg by mouth daily.         . Calcium 600-200 MG-UNIT per tablet   Oral   Take 1 tablet by mouth daily.         . calcium carbonate (TUMS) 500 MG chewable tablet   Oral   Chew 1 tablet by mouth daily.         . Fe Fum-FA-B Cmp-C-Zn-Mg-Mn-Cu (HEMATINIC PLUS VIT/MINERALS) 106-1 MG TABS   Oral   Take 1 tablet by mouth daily.         Marland Kitchen losartan (COZAAR) 50 MG tablet   Oral   Take 50 mg by mouth daily.         . simvastatin (ZOCOR) 40 MG tablet   Oral   Take 40 mg by mouth every evening.         Marland Kitchen spironolactone (ALDACTONE) 25 MG tablet   Oral   Take 25 mg by mouth daily.          Triage Vitals: BP 171/76  Pulse 83  Temp(Src) 98.3 F (36.8 C) (Oral)  Ht 5\' 7"  (1.702 m)  Wt 145 lb (65.772 kg)  BMI 22.71 kg/m2  SpO2  94%  Physical Exam  Nursing note and vitals reviewed.  CONSTITUTIONAL: Well developed/well nourished HEAD: Normocephalic, abrasion to the scalp, no active bleeding EYES: EOMI ENMT: Mucous membranes moist, No evidence of facial/nasal trauma NECK: supple no meningeal signs SPINE:entire spine nontender, No bruising/crepitance/stepoffs noted to spine NEXUS criteria met CV: S1/S2 noted, no murmurs/rubs/gallops noted LUNGS: crackles bilaterally, no apparent distress Chest - tender to palpation on left chest.   ABDOMEN: soft, nontender, no rebound or guarding, no bruising noted GU:no cva tenderness NEURO: Pt is awake/alert, moves all extremitiesx4, GCS 15, no focal weakness noted EXTREMITIES: pulses normal, full ROM, all extremities/joints palpated/ranged and nontender, pelvis is stable SKIN: warm, color normal PSYCH: no abnormalities of mood noted  ED Course  Procedures (including critical care time) CRITICAL  CARE Performed by: Joya Gaskins Total critical care time: 35 Critical care time was exclusive of separately billable procedures and treating other patients. Critical care was necessary to treat or prevent imminent or life-threatening deterioration. Critical care was time spent personally by me on the following activities: development of treatment plan with patient and/or surrogate as well as nursing, discussions with consultants, evaluation of patient's response to treatment, examination of patient, obtaining history from patient or surrogate, ordering and performing treatments and interventions, ordering and review of laboratory studies, ordering and review of radiographic studies, pulse oximetry and re-evaluation of patient's condition.   DIAGNOSTIC STUDIES: Oxygen Saturation is 94% on room air, adequate by my interpretation.    COORDINATION OF CARE: 11:27 AM-Discussed treatment plan which includes CXR with pt at bedside and pt agreed to plan. Pt declined pain medication.  1:13 PM Pt found to have IVH by CT imaging Also noted to left rib fractures He has no CTL tenderness No abd tenderness and no extremity trauma.  He is awake/alert, no distress Call placed to neurosurgery 1:29 PM D/w dr Danielle Dess, he reviewed Ct imaging.  He feels he can be admitted to trauma service 1:37 PM D/w dr wyatt, will accept to trauma service on stepdown Pt now requiring oxygen but he is not in distress  Labs Review Labs Reviewed  CBC WITH DIFFERENTIAL - Abnormal; Notable for the following:    WBC 11.2 (*)    RBC 3.70 (*)    Hemoglobin 12.0 (*)    HCT 37.4 (*)    MCV 101.1 (*)    Platelets 135 (*)    Neutrophils Relative % 80 (*)    Neutro Abs 9.0 (*)    Lymphocytes Relative 9 (*)    All other components within normal limits  BASIC METABOLIC PANEL - Abnormal; Notable for the following:    Glucose, Bld 134 (*)    BUN 35 (*)    Creatinine, Ser 1.49 (*)    GFR calc non Af Amer 40 (*)    GFR calc  Af Amer 46 (*)    All other components within normal limits  PROTIME-INR  APTT   Imaging Review Ct Head Wo Contrast  08/24/2013   CLINICAL DATA:  Recent fall.  EXAM: CT HEAD WITHOUT CONTRAST  TECHNIQUE: Contiguous axial images were obtained from the base of the skull through the vertex without intravenous contrast.  COMPARISON:  None.  FINDINGS: High-density hemorrhage is present in the intraventricular septum consistent with acute hemorrhage. Small amount of high-density hemorrhage in the occipital horns bilaterally.  Generalized atrophy.  Negative for acute hydrocephalus.  Chronic microvascular ischemic change in the white matter. No acute infarct or mass. Negative for skull lesion.  IMPRESSION: Intraventricular  hemorrhage, most likely due to recent trauma.  Atrophy and chronic microvascular ischemic change.  Critical Value/emergent results were called by telephone at the time of interpretation on 08/24/2013 at 12:39 PM to [facility] , who verbally acknowledged these results.   Electronically Signed   By: Marlan Palau M.D.   On: 08/24/2013 12:40   Dg Chest Portable 1 View  08/24/2013   CLINICAL DATA:  Recent fall  EXAM: PORTABLE CHEST - 1 VIEW  COMPARISON:  04/08/2012.  FINDINGS: Left 4th and 5th rib fracture suspected and not appreciated on the prior examination and therefore possibly acute. Question if the patient is tender in this region?  No gross pneumothorax.  Pulmonary edema superimposed upon chronic lung changes.  No segmental consolidation although evaluation of the left lung base is limited by the cardiomegaly.  Calcified tortuous aorta.  Thoracic spine not adequately assessed on the present exam.  IMPRESSION: Left 4th and 5th rib fracture suspected and not appreciated on the prior examination and therefore possibly acute. Question if the patient is tender in this region?  No gross pneumothorax.  Pulmonary edema superimposed upon chronic lung changes.  No segmental consolidation  although evaluation of the left lung base is limited by the cardiomegaly.  Calcified tortuous aorta.   Electronically Signed   By: Bridgett Larsson M.D.   On: 08/24/2013 11:57    EKG Interpretation    Date/Time:  Monday August 24 2013 11:34:46 EST Ventricular Rate:  79 PR Interval:  160 QRS Duration: 80 QT Interval:  422 QTC Calculation: 483 R Axis:   84 Text Interpretation:  Normal sinus rhythm Nonspecific T wave abnormality Prolonged QT Abnormal ECG When compared with ECG of 08-Apr-2012 10:09, No significant change was found            MDM  No diagnosis found. Nursing notes including past medical history and social history reviewed and considered in documentation xrays reviewed and considered Labs/vital reviewed and considered   I personally performed the services described in this documentation, which was scribed in my presence. The recorded information has been reviewed and is accurate.    Joya Gaskins, MD 08/24/13 [id number]

## 2013-08-24 NOTE — ED Notes (Signed)
Pt states he fell on escalaltor recently and was seen after the fall. Pt went to [facility] in [place]. Pt states sob and swelling to lower extremities began this morning. States ribs hurt when he breathes or with certain movement. Pt states xrays of ribs were negative for fx.

## 2013-08-24 NOTE — Progress Notes (Signed)
Patient received to room 3s bed 9 from [facility] ED via carelink. Patient settled into the bed. Vitals obtained. PA for trauma present. Waiting on further orders. Patient has been instructed to call for assistance and not get self out of bed.

## 2013-08-24 NOTE — H&P (Signed)
Calvin Rivas is an 79 y.o. male.   Chief Complaint: Fall with multiple rib fractures HPI: 79 year old male that fell down an escalator Saturday. He states he was shopping and was going up the escalator when his cane got stuck and caused him to fall and roll several times down the escalator. He fell backwards striking the back of his head and his left side. He denies LOC following the fall. He currently denies headache, blurry vision, N/V. His only current complaint is left sided rib pain, which is the reason he presented to the ED today.   Past Medical History  Diagnosis Date  . Hypertension   . Hypercholesterolemia   . Reflux     History reviewed. No pertinent past surgical history.  No family history on file. Social History:  reports that he has never smoked. He does not have any smokeless tobacco history on file. He reports that he does not drink alcohol or use illicit drugs.  Allergies:  Allergies  Allergen Reactions  . Penicillins Rash and Other (See Comments)    Pt states dr told him if he took penicillin again "it could be the last time"    Medications Prior to Admission  Medication Sig Dispense Refill  . aspirin 81 MG chewable tablet Chew 81 mg by mouth daily.      Marland Kitchen CALCIUM PO Take 1 tablet by mouth daily.      Marland Kitchen doxycycline (VIBRAMYCIN) 100 MG capsule Take 100 mg by mouth 2 (two) times daily. 10 day course starting on 08/22/2013      . Fe Fum-FA-B Cmp-C-Zn-Mg-Mn-Cu (HEMATINIC PLUS VIT/MINERALS) 106-1 MG TABS Take 1 tablet by mouth daily.      Marland Kitchen losartan (COZAAR) 50 MG tablet Take 50 mg by mouth daily.      . meloxicam (MOBIC) 15 MG tablet Take 15 mg by mouth daily.      . simvastatin (ZOCOR) 40 MG tablet Take 40 mg by mouth every evening.      Marland Kitchen spironolactone (ALDACTONE) 25 MG tablet Take 25 mg by mouth daily.      . calcium carbonate (TUMS) 500 MG chewable tablet Chew 1 tablet by mouth daily.      . Vitamin D, Ergocalciferol, (DRISDOL) 50000 UNITS CAPS capsule Take  50,000 Units by mouth every 7 (seven) days.        Results for orders placed during the hospital encounter of 08/24/13 (from the past 48 hour(s))  CBC WITH DIFFERENTIAL     Status: Abnormal   Collection Time    08/24/13 11:44 AM      Result Value Range   WBC 11.2 (*) 4.0 - 10.5 K/uL   RBC 3.70 (*) 4.22 - 5.81 MIL/uL   Hemoglobin 12.0 (*) 13.0 - 17.0 g/dL   HCT 16.1 (*) 09.6 - 04.5 %   MCV 101.1 (*) 78.0 - 100.0 fL   MCH 32.4  26.0 - 34.0 pg   MCHC 32.1  30.0 - 36.0 g/dL   RDW 40.9  81.1 - 91.4 %   Platelets 135 (*) 150 - 400 K/uL   Neutrophils Relative % 80 (*) 43 - 77 %   Neutro Abs 9.0 (*) 1.7 - 7.7 K/uL   Lymphocytes Relative 9 (*) 12 - 46 %   Lymphs Abs 1.1  0.7 - 4.0 K/uL   Monocytes Relative 8  3 - 12 %   Monocytes Absolute 0.9  0.1 - 1.0 K/uL   Eosinophils Relative 2  0 - 5 %  Eosinophils Absolute 0.2  0.0 - 0.7 K/uL   Basophils Relative 0  0 - 1 %   Basophils Absolute 0.0  0.0 - 0.1 K/uL  BASIC METABOLIC PANEL     Status: Abnormal   Collection Time    08/24/13 11:44 AM      Result Value Range   Sodium 142  135 - 145 mEq/L   Potassium 4.2  3.5 - 5.1 mEq/L   Chloride 105  96 - 112 mEq/L   CO2 27  19 - 32 mEq/L   Glucose, Bld 134 (*) 70 - 99 mg/dL   BUN 35 (*) 6 - 23 mg/dL   Creatinine, Ser 0.98 (*) 0.50 - 1.35 mg/dL   Calcium 9.4  8.4 - 11.9 mg/dL   GFR calc non Af Amer 40 (*) >90 mL/min   GFR calc Af Amer 46 (*) >90 mL/min   Comment: (NOTE)     The eGFR has been calculated using the CKD EPI equation.     This calculation has not been validated in all clinical situations.     eGFR's persistently <90 mL/min signify possible Chronic Kidney     Disease.  PROTIME-INR     Status: None   Collection Time    08/24/13 12:39 PM      Result Value Range   Prothrombin Time 14.2  11.6 - 15.2 seconds   INR 1.12  0.00 - 1.49  APTT     Status: None   Collection Time    08/24/13 12:39 PM      Result Value Range   aPTT 33  24 - 37 seconds   Ct Head Wo  Contrast  08/24/2013   CLINICAL DATA:  Recent fall.  EXAM: CT HEAD WITHOUT CONTRAST  TECHNIQUE: Contiguous axial images were obtained from the base of the skull through the vertex without intravenous contrast.  COMPARISON:  None.  FINDINGS: High-density hemorrhage is present in the intraventricular septum consistent with acute hemorrhage. Small amount of high-density hemorrhage in the occipital horns bilaterally.  Generalized atrophy.  Negative for acute hydrocephalus.  Chronic microvascular ischemic change in the white matter. No acute infarct or mass. Negative for skull lesion.  IMPRESSION: Intraventricular hemorrhage, most likely due to recent trauma.  Atrophy and chronic microvascular ischemic change.  Critical Value/emergent results were called by telephone at the time of interpretation on 08/24/2013 at 12:39 PM to [facility] , who verbally acknowledged these results.   Electronically Signed   By: Marlan Palau M.D.   On: 08/24/2013 12:40   Dg Chest Portable 1 View  08/24/2013   CLINICAL DATA:  Recent fall  EXAM: PORTABLE CHEST - 1 VIEW  COMPARISON:  04/08/2012.  FINDINGS: Left 4th and 5th rib fracture suspected and not appreciated on the prior examination and therefore possibly acute. Question if the patient is tender in this region?  No gross pneumothorax.  Pulmonary edema superimposed upon chronic lung changes.  No segmental consolidation although evaluation of the left lung base is limited by the cardiomegaly.  Calcified tortuous aorta.  Thoracic spine not adequately assessed on the present exam.  IMPRESSION: Left 4th and 5th rib fracture suspected and not appreciated on the prior examination and therefore possibly acute. Question if the patient is tender in this region?  No gross pneumothorax.  Pulmonary edema superimposed upon chronic lung changes.  No segmental consolidation although evaluation of the left lung base is limited by the cardiomegaly.  Calcified tortuous aorta.    Electronically Signed  By: Bridgett Larsson M.D.   On: 08/24/2013 11:57    Review of Systems  Constitutional: Negative for malaise/fatigue and diaphoresis.  Eyes: Negative for blurred vision and double vision.  Respiratory: Negative for shortness of breath.   Cardiovascular: Negative for chest pain.  Gastrointestinal: Negative for nausea, vomiting and abdominal pain.  Musculoskeletal: Positive for falls.       Left sided rib pain   Skin: Negative for rash.  Neurological: Negative for dizziness, loss of consciousness and headaches.  Endo/Heme/Allergies: Does not bruise/bleed easily.    Blood pressure 169/83, pulse 73, temperature 97.5 F (36.4 C), temperature source Oral, resp. rate 20, height 5\' 7"  (1.702 m), weight 141 lb 12.1 oz (64.3 kg), SpO2 99.00%. Physical Exam  Constitutional: He is oriented to person, place, and time. He appears well-developed and well-nourished.  HENT:  Head: Normocephalic.    Eyes: Pupils are equal, round, and reactive to light.  Neck: No tracheal deviation present. No thyromegaly present.  Cardiovascular: Normal rate and regular rhythm.   Respiratory: Effort normal and breath sounds normal. No respiratory distress. He has no rales.  GI: Soft. Bowel sounds are normal. There is no tenderness.  Musculoskeletal: Normal range of motion.  Neurological: He is alert and oriented to person, place, and time.  Skin: Skin is warm and dry.  Psychiatric: He has a normal mood and affect.     Assessment/Plan Fall Intraventricular hemorrhage: fall occurred Saturday morning. Head CT today showed intraventricular hemorrhage likely due to recent trauma. Due to fall occurring 2 days ago and no neuro changes or complaints, no repeat head CT necessary at this time.  Left rib fractures: Tramadol for pain every 6 hours PRN. Morphine 2 mg IV every 4 hours for break through pain only.  Anemia: patient is currently asymptomatic. Repeat CBC tomorrow Multiple medical problems:  continue home medications as tolerated/able  Blue, Olivia C 08/24/2013, 5:18 PM  The intraventricular hemorrhage appears to be a secondary finding without significant clinical consequences.  He is mainly here for rib fracture pain management.  This patient has been seen and I agree with the findings and treatment plan.  Marta Lamas. Gae Bon, MD, FACS [phone] (pager) [phone] (direct pager) Trauma Surgeon

## 2013-08-25 ENCOUNTER — Observation Stay (HOSPITAL_COMMUNITY): Payer: Medicare Other

## 2013-08-25 DIAGNOSIS — W19XXXA Unspecified fall, initial encounter: Secondary | ICD-10-CM

## 2013-08-25 DIAGNOSIS — S2242XA Multiple fractures of ribs, left side, initial encounter for closed fracture: Secondary | ICD-10-CM

## 2013-08-25 DIAGNOSIS — S066X9A Traumatic subarachnoid hemorrhage with loss of consciousness of unspecified duration, initial encounter: Secondary | ICD-10-CM

## 2013-08-25 DIAGNOSIS — N179 Acute kidney failure, unspecified: Secondary | ICD-10-CM | POA: Diagnosis present

## 2013-08-25 DIAGNOSIS — I10 Essential (primary) hypertension: Secondary | ICD-10-CM | POA: Insufficient documentation

## 2013-08-25 DIAGNOSIS — E7800 Pure hypercholesterolemia, unspecified: Secondary | ICD-10-CM | POA: Insufficient documentation

## 2013-08-25 DIAGNOSIS — D62 Acute posthemorrhagic anemia: Secondary | ICD-10-CM | POA: Diagnosis present

## 2013-08-25 DIAGNOSIS — IMO0001 Reserved for inherently not codable concepts without codable children: Secondary | ICD-10-CM

## 2013-08-25 LAB — CBC
HCT: 30.9 % — ABNORMAL LOW (ref 39.0–52.0)
MCH: 33.2 pg (ref 26.0–34.0)
MCHC: 33 g/dL (ref 30.0–36.0)
MCV: 100.7 fL — ABNORMAL HIGH (ref 78.0–100.0)
Platelets: 157 10*3/uL (ref 150–400)
RDW: 14.9 % (ref 11.5–15.5)
WBC: 8.4 10*3/uL (ref 4.0–10.5)

## 2013-08-25 LAB — BASIC METABOLIC PANEL
BUN: 36 mg/dL — ABNORMAL HIGH (ref 6–23)
Creatinine, Ser: 1.4 mg/dL — ABNORMAL HIGH (ref 0.50–1.35)
GFR calc Af Amer: 49 mL/min — ABNORMAL LOW (ref >90)
GFR calc non Af Amer: 43 mL/min — ABNORMAL LOW (ref >90)
Potassium: 5 mEq/L (ref 3.5–5.1)

## 2013-08-25 NOTE — Progress Notes (Signed)
Occupational Therapy Evaluation Patient Details Name: Calvin Rivas MRN: [id number] DOB: August 20, 1921 Today's Date: 08/25/2013 Time: 7829-5621 OT Time Calculation (min): 33 min  OT Assessment / Plan / Recommendation History of present illness Pt s/p fall off escalator 2 days ago presenting with chest/rib pain.   Clinical Impression   Niece called while Clinical research associate was in room and she states she has come down from [place] and will stay with pt 24/7 until September 13, 2013. He presents with decreased safety awareness, decreased activity tolerance, decreased mobility, increased pain, all of which impact ADL performance.    OT Assessment  Patient needs continued OT Services    Follow Up Recommendations  Home health OT;Supervision/Assistance - 24 hour (spoke with niece who will stay with pt until 09/13/13)    Barriers to Discharge      Equipment Recommendations  3 in 1 bedside comode;Tub/shower seat    Recommendations for Other Services    Frequency  Min 2X/week    Precautions / Restrictions Precautions Precautions: Fall Restrictions Weight Bearing Restrictions: No   Pertinent Vitals/Pain C/o L rib pain with movement    ADL  Eating/Feeding: Performed;Set up Where Assessed - Eating/Feeding: Chair Grooming: Performed;Wash/dry hands;Wash/dry face;Brushing hair;Set up Where Assessed - Grooming: Supported sitting Upper Body Bathing: Simulated;Minimal assistance Where Assessed - Upper Body Bathing: Supported sitting Lower Body Bathing: Moderate assistance;Simulated Where Assessed - Lower Body Bathing: Supported sit to stand Lower Body Dressing: Maximal assistance (can reach to ankles but unable to don/doff socks) Where Assessed - Lower Body Dressing: Supported sitting Toilet Transfer: Simulated;Minimal assistance Toilet Transfer Method: Sit to stand Toilet Transfer Equipment: Bedside commode Transfers/Ambulation Related to ADLs: Patient up in chair on arrival. Sit to stand with min A  and increased pain (ribs). Min A stand to sit. ADL Comments: Difficult for pt to stay focused -- needs frequent redirection. Able to reach to ankles from seated position with pain -- may benefit from AE    OT Diagnosis: Generalized weakness;Acute pain  OT Problem List: Decreased strength;Decreased range of motion;Decreased activity tolerance;Impaired balance (sitting and/or standing);Decreased safety awareness;Decreased knowledge of use of DME or AE;Pain OT Treatment Interventions: Self-care/ADL training;Therapeutic exercise;DME and/or AE instruction;Therapeutic activities;Patient/family education   OT Goals(Current goals can be found in the care plan section) Acute Rehab OT Goals Patient Stated Goal: home OT Goal Formulation: With patient Time For Goal Achievement: 09/08/13 Potential to Achieve Goals: Good  Visit Information  Last OT Received On: 08/25/13 Assistance Needed: +1 History of Present Illness: Pt s/p fall off escalator 2 days ago presenting with chest/rib pain.       Prior Functioning     Home Living Family/patient expects to be discharged to:: Private residence Living Arrangements: Alone Available Help at Discharge: Family;Available 24 hours/day (niece called -- will stay with pt until 09/13/13) Type of Home: House Home Access: Stairs to enter [employer] of Steps: 5 Entrance Stairs-Rails: Right Home Layout: One level Home Equipment: Walker - 4 wheels;Grab bars - tub/shower;Shower seat Prior Function Level of Independence: Independent Comments: pt drives, does grocery shopping and visits nusring home 1x/wk Communication Communication: No difficulties Dominant Hand: Right         Vision/Perception     Cognition  Cognition Arousal/Alertness: Awake/alert Behavior During Therapy: WFL for tasks assessed/performed Overall Cognitive Status: Within Functional Limits for tasks assessed    Extremity/Trunk Assessment Upper Extremity Assessment Upper  Extremity Assessment: Generalized weakness     Mobility       Exercise     Balance  End of Session OT - End of Session Equipment Utilized During Treatment: Gait belt Activity Tolerance: Patient limited by pain;Patient tolerated treatment well Patient left: in chair;with call bell/phone within reach;with family/visitor present Nurse Communication: Mobility status  GO Functional Limitation: Self care Self Care Current Status (O5366): At least 40 percent but less than 60 percent impaired, limited or restricted Self Care Goal Status (Y4034): At least 1 percent but less than 20 percent impaired, limited or restricted   Khaleef Ruby A 08/25/2013, 3:50 PM

## 2013-08-25 NOTE — Evaluation (Signed)
Physical Therapy Evaluation Patient Details Name: Calvin Rivas MRN: [id number] DOB: 1921-08-17 Today's Date: 08/25/2013 Time: 8295-6213 PT Time Calculation (min): 42 min  PT Assessment / Plan / Recommendation History of Present Illness  Pt s/p fall off escalator 2 days ago presenting with chest/rib pain.  Clinical Impression  Pt tolerated mobility well for first time OOB. Spoke extensively with pt regarding d/c recommendations. Pt agrees and is aware he is unsafe to return home alone due to increased falls risk and dependence on RW at this time. Pt is talking to family and friends regarding them providing 24/7 assist upon d/c so he can return home. If 24/7 assist can not be provided pt with need ST-SNF to achieve safe mod I function for safe transition home.    PT Assessment  Patient needs continued PT services    Follow Up Recommendations  Home health PT;Supervision/Assistance - 24 hour (will need SNF if pt can't find 24/7 assist)    Does the patient have the potential to tolerate intense rehabilitation      Barriers to Discharge Decreased caregiver support      Equipment Recommendations  Rolling walker with 5" wheels;3in1 (PT)    Recommendations for Other Services     Frequency Min 4X/week    Precautions / Restrictions Precautions Precautions: Fall Restrictions Weight Bearing Restrictions: No   Pertinent Vitals/Pain Chest/rib pain with mobility, pt did not rate      Mobility  Bed Mobility Bed Mobility: Supine to Sit;Sitting - Scoot to Edge of Bed Supine to Sit: 3: Mod assist;With rails;HOB elevated Sitting - Scoot to Edge of Bed: 4: Min assist;With rail Details for Bed Mobility Assistance: increased time due to L chest/rib pain, assist for trunk elevation Transfers Transfers: Sit to Stand;Stand to Sit Sit to Stand: 4: Min assist;With upper extremity assist;From bed Stand to Sit: 4: Min assist;With upper extremity assist;To chair/3-in-1 Details for Transfer  Assistance: v/c's for safe hand placement. assist to initiate transfer/anterior weight shift Ambulation/Gait Ambulation/Gait Assistance: 4: Min assist Ambulation Distance (Feet): 75 Feet Assistive device: Rolling walker Ambulation/Gait Assistance Details: max directional v/c's for walker management. pt with increased trunk flexion, v/c's to stay in walker Gait Pattern: Step-through pattern;Decreased stride length;Antalgic Gait velocity: slow General Gait Details: noted L LE limp, decreased stand time Stairs: No    Exercises     PT Diagnosis: Difficulty walking;Generalized weakness;Acute pain  PT Problem List: Decreased strength;Decreased activity tolerance;Decreased balance;Decreased mobility PT Treatment Interventions: DME instruction;Gait training;Stair training;Functional mobility training;Therapeutic activities;Therapeutic exercise     PT Goals(Current goals can be found in the care plan section) Acute Rehab PT Goals Patient Stated Goal: home PT Goal Formulation: With patient Time For Goal Achievement: 09/01/13 Potential to Achieve Goals: Good  Visit Information  Last PT Received On: 08/25/13 Assistance Needed: +1 History of Present Illness: Pt s/p fall off escalator 2 days ago presenting with chest/rib pain.       Prior Functioning  Home Living Family/patient expects to be discharged to:: Private residence Living Arrangements: Alone Available Help at Discharge: Family;Available PRN/intermittently (pt working on finding 24/7 assist) Type of Home: House Home Access: Stairs to enter Secretary/administrator of Steps: 5 Entrance Stairs-Rails: Right Home Layout: One level Home Equipment: Walker - 4 wheels Prior Function Level of Independence: Independent Comments: pt drives, does grocery shopping and visits nusring home 1x/wk Communication Communication: No difficulties Dominant Hand: Right    Cognition  Cognition Arousal/Alertness: Awake/alert Behavior During  Therapy: WFL for tasks assessed/performed Overall Cognitive Status: Within  Functional Limits for tasks assessed    Extremity/Trunk Assessment Upper Extremity Assessment Upper Extremity Assessment: Generalized weakness Lower Extremity Assessment Lower Extremity Assessment: Generalized weakness Cervical / Trunk Assessment Cervical / Trunk Assessment: Normal   Balance    End of Session PT - End of Session Equipment Utilized During Treatment: Gait belt Activity Tolerance: Patient limited by fatigue Patient left: in chair;with call bell/phone within reach Nurse Communication: Mobility status  GP Functional Assessment Tool Used: clinical judgment Functional Limitation: Mobility: Walking and moving around Mobility: Walking and Moving Around Current Status (W0981): At least 40 percent but less than 60 percent impaired, limited or restricted Mobility: Walking and Moving Around Goal Status [phone]): At least 1 percent but less than 20 percent impaired, limited or restricted   Marcene Brawn 08/25/2013, 12:30 PM  Lewis Shock, PT, DPT Pager #: [phone] Office #: [phone]

## 2013-08-25 NOTE — Progress Notes (Signed)
LOS: 1 day   Subjective: Pt feels fine, very alert today.  C/o pain in left ribs, back of scalp, and left knee.  Also notes skin tear on right hand, but this is not painful.  Pt tolerating a regular diet.  Urinating well, no BM since 08/22/13.  Objective: Vital signs in last 24 hours: Temp:  [97.5 F (36.4 C)-98.3 F (36.8 C)] 97.6 F (36.4 C) (11/18 0351) Pulse Rate:  [66-83] 66 (11/18 0351) Resp:  [13-22] 13 (11/18 0351) BP: (109-171)/(59-83) 109/59 mmHg (11/18 0351) SpO2:  [89 %-99 %] 97 % (11/18 0351) Weight:  [141 lb 12.1 oz (64.3 kg)-145 lb (65.772 kg)] 141 lb 12.1 oz (64.3 kg) (11/17 1615) Last BM Date: 08/22/13  Lab Results:  CBC  Recent Labs  08/24/13 1144 08/25/13 0515  WBC 11.2* 8.4  HGB 12.0* 10.2*  HCT 37.4* 30.9*  PLT 135* 157   BMET  Recent Labs  08/24/13 1144 08/25/13 0515  NA 142 140  K 4.2 5.0  CL 105 107  CO2 27 24  GLUCOSE 134* 99  BUN 35* 36*  CREATININE 1.49* 1.40*  CALCIUM 9.4 8.2*    Imaging: Ct Head Wo Contrast  08/24/2013   CLINICAL DATA:  Recent fall.  EXAM: CT HEAD WITHOUT CONTRAST  TECHNIQUE: Contiguous axial images were obtained from the base of the skull through the vertex without intravenous contrast.  COMPARISON:  None.  FINDINGS: High-density hemorrhage is present in the intraventricular septum consistent with acute hemorrhage. Small amount of high-density hemorrhage in the occipital horns bilaterally.  Generalized atrophy.  Negative for acute hydrocephalus.  Chronic microvascular ischemic change in the white matter. No acute infarct or mass. Negative for skull lesion.  IMPRESSION: Intraventricular hemorrhage, most likely due to recent trauma.  Atrophy and chronic microvascular ischemic change.  Critical Value/emergent results were called by telephone at the time of interpretation on 08/24/2013 at 12:39 PM to [facility] , who verbally acknowledged these results.   Electronically Signed   By: Marlan Palau M.D.   On:  08/24/2013 12:40   Dg Chest Portable 1 View  08/24/2013   CLINICAL DATA:  Recent fall  EXAM: PORTABLE CHEST - 1 VIEW  COMPARISON:  04/08/2012.  FINDINGS: Left 4th and 5th rib fracture suspected and not appreciated on the prior examination and therefore possibly acute. Question if the patient is tender in this region?  No gross pneumothorax.  Pulmonary edema superimposed upon chronic lung changes.  No segmental consolidation although evaluation of the left lung base is limited by the cardiomegaly.  Calcified tortuous aorta.  Thoracic spine not adequately assessed on the present exam.  IMPRESSION: Left 4th and 5th rib fracture suspected and not appreciated on the prior examination and therefore possibly acute. Question if the patient is tender in this region?  No gross pneumothorax.  Pulmonary edema superimposed upon chronic lung changes.  No segmental consolidation although evaluation of the left lung base is limited by the cardiomegaly.  Calcified tortuous aorta.   Electronically Signed   By: Bridgett Larsson M.D.   On: 08/24/2013 11:57     PE: General: pleasant, WD/WN white male who is laying in bed in NAD HEENT: head is normocephalic, left posterior scalp abrasion/laceration with dried sanguinous drainage.  Sclera are noninjected.  PERRL.  Ears and nose without any masses or lesions.  Mouth is pink and moist Heart: regular, rate, and rhythm.  No gallops, or rubs noted.  Palpable radial and pedal pulses bilaterally Lungs: CTAB, no wheezes,  rhonchi, or rales noted.  Respiratory effort nonlabored Abd: soft, NT/ND, +BS, no masses MS: abrasion to right outer knee, mildly tender, grossly intact ROM Skin: warm and dry with no masses, abrasion to right outer knee and skin tear on right hand Neuro/Psych: A&Ox3 with an appropriate affect   Assessment/Plan: Fall Intraventricular hemorrhage - hold off on repeat CT unless neuro changes, local wound care to scalp abrasion Left rib fx - pulm toilet Right knee  abrasion/pain - order xray ABL anemia - repeat tomorrow Tachycardia - intermittent lasted a few seconds around 120's, will continue to monitor Elevated Creatinine - Down to 1.4 today Multiple medical problems - on home meds VTE - SCD's, no dvt proph meds due to IVH FEN - reg diet, red IVF to 14mL/hour to avoid fluid overload, bowel regimen  Dispo -- PT/OT   Aris Georgia, PA-C Pager: [phone] General Trauma PA Pager: [phone]   08/25/2013

## 2013-08-25 NOTE — Progress Notes (Signed)
Episode of tachycardia resolved spontaneously. Working on pulmonary toilet. Does not have someone to stay with him 24h at D/C. Watch on 3S today. Patient examined and I agree with the assessment and plan  Violeta Gelinas, MD, MPH, FACS Pager: [phone]  08/25/2013 1:23 PM

## 2013-08-26 ENCOUNTER — Inpatient Hospital Stay (HOSPITAL_COMMUNITY): Payer: Medicare Other

## 2013-08-26 DIAGNOSIS — R0902 Hypoxemia: Secondary | ICD-10-CM

## 2013-08-26 LAB — CBC
MCH: 33.2 pg (ref 26.0–34.0)
MCHC: 32.5 g/dL (ref 30.0–36.0)
RDW: 14.7 % (ref 11.5–15.5)
WBC: 8 10*3/uL (ref 4.0–10.5)

## 2013-08-26 LAB — BASIC METABOLIC PANEL
BUN: 43 mg/dL — ABNORMAL HIGH (ref 6–23)
Calcium: 8.2 mg/dL — ABNORMAL LOW (ref 8.4–10.5)
GFR calc Af Amer: 42 mL/min — ABNORMAL LOW (ref >90)
GFR calc non Af Amer: 36 mL/min — ABNORMAL LOW (ref >90)
Potassium: 5.4 mEq/L — ABNORMAL HIGH (ref 3.5–5.1)
Sodium: 136 mEq/L (ref 135–145)

## 2013-08-26 MED ORDER — POTASSIUM CHLORIDE IN NACL 20-0.45 MEQ/L-% IV SOLN
INTRAVENOUS | Status: DC
  Administered 2013-08-26: 16:00:00 via INTRAVENOUS
  Filled 2013-08-26: qty 1000

## 2013-08-26 NOTE — Progress Notes (Signed)
Physical Therapy Treatment Patient Details Name: Calvin Rivas MRN: [id number] DOB: 1920-10-16 Today's Date: 08/26/2013 Time: 4098-1191 PT Time Calculation (min): 38 min  PT Assessment / Plan / Recommendation  History of Present Illness Pt s/p fall off escalator 2 days ago presenting with chest/rib pain.   PT Comments   Patient tolerated mobility including stair training well with report of mild fatigue and LE pain afterwards. Continues to show deficits in strength during transfers and continues to require assistance with safe use of walker during ambulation. Pt continues to be a high fall risk and requires 24/7 supervision for safe d/c. Niece was present in the room and states she is available to assist him in his home as long as needed.  Follow Up Recommendations  Home health PT;Supervision/Assistance - 24 hour     Does the patient have the potential to tolerate intense rehabilitation     Barriers to Discharge        Equipment Recommendations  Rolling walker with 5" wheels;3in1 (PT)    Recommendations for Other Services    Frequency Min 4X/week   Progress towards PT Goals Progress towards PT goals: Progressing toward goals  Plan Current plan remains appropriate    Precautions / Restrictions Precautions Precautions: Fall Restrictions Weight Bearing Restrictions: No   Pertinent Vitals/Pain SpO2 on RA 89%, dropped to 84% on RA with activity; 98% on 2L with activity, seated following activity, 92% on RA.    Mobility  Bed Mobility Bed Mobility: Not assessed Details for Bed Mobility Assistance: Pt seated in chair upon arrival. Transfers Transfers: Sit to Stand;Stand to Sit Sit to Stand: 4: Min assist;With upper extremity assist;From chair/3-in-1 Stand to Sit: 4: Min assist;To chair/3-in-1;With upper extremity assist Details for Transfer Assistance: Assist needed for anterior weight shift  Ambulation/Gait Ambulation/Gait Assistance: 4: Min assist Ambulation Distance (Feet):  75 Feet Assistive device: Rolling walker Ambulation/Gait Assistance Details: V/c to stay inside RW, pt with increased trunk flexion Gait Pattern: Step-through pattern;Decreased stride length;Trunk flexed Gait velocity: decreased General Gait Details: noted LLE limp; pt reports both LE sore and stiff; Stairs: Yes Stairs Assistance: 3: Mod assist Stairs Assistance Details (indicate cue type and reason): required modassist to correct excess weight shift to R; v/c for safe hand placement and LE sequencing Stair Management Technique: Step to pattern;One rail Right (used handheld assist throughout; R rail up, L rail down;) Number of Stairs: 3    Exercises     PT Diagnosis:    PT Problem List:   PT Treatment Interventions:     PT Goals (current goals can now be found in the care plan section) Acute Rehab PT Goals Patient Stated Goal: home  Visit Information  Last PT Received On: 08/26/13 Assistance Needed: +1 History of Present Illness: Pt s/p fall off escalator 2 days ago presenting with chest/rib pain.    Subjective Data  Patient Stated Goal: home   Cognition  Cognition Arousal/Alertness: Awake/alert Behavior During Therapy: WFL for tasks assessed/performed Overall Cognitive Status: Within Functional Limits for tasks assessed    Balance     End of Session PT - End of Session Equipment Utilized During Treatment: Gait belt Activity Tolerance: Patient limited by fatigue;Patient limited by pain Patient left: in chair;with call bell/phone within reach Nurse Communication: Mobility status   GP     Willette Pa, SPT 08/26/2013, 11:45 AM

## 2013-08-26 NOTE — Progress Notes (Signed)
Chaplain made initial visit. Patient was alert and extremely talkative. His niece, Lupita Leash, was also present. Pt wanted to share about his life experiences, his spiritual gifts, and many stories. 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 Chaplain expressed how it's not his fault for having this gift, but rather, it's how he uses it. 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 Pt seemed receptive to that. Pt continued to share about his life until a staff member came to transport him to 6N.

## 2013-08-26 NOTE — Progress Notes (Signed)
Pt transferred to 6N22; vss; pt. Alert and oriented.  Report called to receiving RN; belongings with patient.  Vivi Martens RN

## 2013-08-26 NOTE — Progress Notes (Signed)
Trauma Service Note  Subjective: Patient very talkative.  Hilarious!  Sats tend to drop when he talks a lot and does not take a deep breath.  Objective: Vital signs in last 24 hours: Temp:  [97.3 F (36.3 C)-97.8 F (36.6 C)] 97.7 F (36.5 C) (11/19 0700) Pulse Rate:  [61-67] 61 (11/19 0715) Resp:  [11-14] 12 (11/19 0715) BP: (99-120)/(50-61) 118/55 mmHg (11/19 0715) SpO2:  [98 %-100 %] 98 % (11/19 0715) Last BM Date: 08/22/13  Intake/Output from previous day: 11/18 0701 - 11/19 0700 In: 1615 [P.O.:320; I.V.:1295] Out: 325 [Urine:325] Intake/Output this shift:    General: No acute distress.  Ambulated throughout the unti yesterday.  Lungs: Clear to auscultation.  IS up to 750 only, but sats come up.  Abd: Soft, good bowel sounds. Eating okay  Extremities: No changes  Neuro: Intact  Lab Results: CBC   Recent Labs  08/25/13 0515 08/26/13 0425  WBC 8.4 8.0  HGB 10.2* 10.4*  HCT 30.9* 32.0*  PLT 157 121*   BMET  Recent Labs  08/25/13 0515 08/26/13 0425  NA 140 136  K 5.0 5.4*  CL 107 105  CO2 24 24  GLUCOSE 99 91  BUN 36* 43*  CREATININE 1.40* 1.61*  CALCIUM 8.2* 8.2*   PT/INR  Recent Labs  08/24/13 1239  LABPROT 14.2  INR 1.12   ABG No results found for this basename: PHART, PCO2, PO2, HCO3,  in the last 72 hours  Studies/Results: Dg Knee 1-2 Views Right  08/25/2013   CLINICAL DATA:  Status post fall.  EXAM: RIGHT KNEE - 1-2 VIEW  COMPARISON:  None.  FINDINGS: There is no fracture or dislocation. There is a small joint effusion. There are tricompartmental degenerative changes most severe in the medial tibial femoral compartment where there is severe joint space narrowing with a bone-on-bone appearance and marginal osteophytosis.  IMPRESSION: No acute osseous injury of the right knee.   Electronically Signed   By: Elige Ko   On: 08/25/2013 10:46   Ct Head Wo Contrast  08/24/2013   CLINICAL DATA:  Recent fall.  EXAM: CT HEAD WITHOUT  CONTRAST  TECHNIQUE: Contiguous axial images were obtained from the base of the skull through the vertex without intravenous contrast.  COMPARISON:  None.  FINDINGS: High-density hemorrhage is present in the intraventricular septum consistent with acute hemorrhage. Small amount of high-density hemorrhage in the occipital horns bilaterally.  Generalized atrophy.  Negative for acute hydrocephalus.  Chronic microvascular ischemic change in the white matter. No acute infarct or mass. Negative for skull lesion.  IMPRESSION: Intraventricular hemorrhage, most likely due to recent trauma.  Atrophy and chronic microvascular ischemic change.  Critical Value/emergent results were called by telephone at the time of interpretation on 08/24/2013 at 12:39 PM to [facility] , who verbally acknowledged these results.   Electronically Signed   By: Marlan Palau M.D.   On: 08/24/2013 12:40   Dg Chest Port 1 View  08/26/2013   CLINICAL DATA:  Subsequent encounter for pulmonary edema and multiple left rib fractures.  EXAM: PORTABLE CHEST - 1 VIEW  COMPARISON:  Portable examinations yesterday and 08/24/2013. Two-view chest x-ray 04/08/2012.  FINDINGS: Multiple left-sided rib fractures as noted previously. Cardiac silhouette markedly enlarged but stable. Diffuse interstitial and airspace pulmonary edema, unchanged. Moderate-sized left pleural effusion and/or hemothorax, unchanged, with associated dense consolidation in the left lower lobe. No new pulmonary parenchymal abnormalities.  IMPRESSION: 1. Stable moderate CHF with diffuse interstitial and airspace pulmonary edema. 2.  Stable left pleural effusion and/or hemothorax related to multiple left rib fractures. 3. Stable dense passive atelectasis and/or pneumonia in the left lower lobe. 4. No new abnormalities.   Electronically Signed   By: Hulan Saas M.D.   On: 08/26/2013 07:28   Dg Chest Port 1 View  08/25/2013   CLINICAL DATA:  Rib fractures.  EXAM: PORTABLE CHEST  - 1 VIEW  COMPARISON:  08/24/2013.  FINDINGS: Again noted are left upper posterior rib fractures. No pneumothorax. Severe cardiomegaly with pulmonary vascular prominence and diffuse interstitial prominence suggesting congestive heart failure, interstitial edema. This may be superimposed on chronic interstitial lung disease. Underlying pneumonitis cannot be excluded. Small left pleural effusion.  IMPRESSION: 1. Again noted are multiple left upper posterior rib fractures. No pneumothorax. 2. Congestive heart failure with pulmonary interstitial edema. This has not cleared from prior exam.   Electronically Signed   By: Maisie Fus  Register   On: 08/25/2013 07:48   Dg Chest Portable 1 View  08/24/2013   CLINICAL DATA:  Recent fall  EXAM: PORTABLE CHEST - 1 VIEW  COMPARISON:  04/08/2012.  FINDINGS: Left 4th and 5th rib fracture suspected and not appreciated on the prior examination and therefore possibly acute. Question if the patient is tender in this region?  No gross pneumothorax.  Pulmonary edema superimposed upon chronic lung changes.  No segmental consolidation although evaluation of the left lung base is limited by the cardiomegaly.  Calcified tortuous aorta.  Thoracic spine not adequately assessed on the present exam.  IMPRESSION: Left 4th and 5th rib fracture suspected and not appreciated on the prior examination and therefore possibly acute. Question if the patient is tender in this region?  No gross pneumothorax.  Pulmonary edema superimposed upon chronic lung changes.  No segmental consolidation although evaluation of the left lung base is limited by the cardiomegaly.  Calcified tortuous aorta.   Electronically Signed   By: Bridgett Larsson M.D.   On: 08/24/2013 11:57    Anti-infectives: Anti-infectives   Start     Dose/Rate Route Frequency Ordered Stop   08/24/13 2200  doxycycline (VIBRA-TABS) tablet 100 mg     100 mg Oral 2 times daily 08/24/13 1732 08/28/13 2159      Assessment/Plan: s/p  Advance  diet Transfer from the SDu to the floor with sat monitoring.  LOS: 2 days   Marta Lamas. Gae Bon, MD, FACS [phone] Trauma Surgeon 08/26/2013

## 2013-08-26 NOTE — Progress Notes (Signed)
Paged Dr. Lindie Spruce regarding patient's urine output.  He only had 125 cc out last night and 200 for the shift yesterday.  No new orders; encourage patient to drink more.  Will continue to monitor.  Vivi Martens RN

## 2013-08-26 NOTE — Clinical Social Work Note (Signed)
Clinical Social Work Department BRIEF PSYCHOSOCIAL ASSESSMENT 08/26/2013  Patient:  Calvin Rivas, Calvin Rivas     Account Number:  [id number]     Admit date:  08/24/2013  Clinical Social Worker:  Verl Blalock  Date/Time:  08/26/2013 02:30 PM  Referred by:  Physician  Date Referred:  08/26/2013 Referred for  Psychosocial assessment   Other Referral:   PT recommending 24 hour care or SNF placement   Interview type:  Patient Other interview type:   Patient neice at bedside - patient goes by Calvin Rivas    PSYCHOSOCIAL DATA Living Status:  ALONE Admitted from facility:   Level of care:   Primary support name:  Calvin Rivas  [phone] Primary support relationship to patient:  FAMILY Degree of support available:   Strong    CURRENT CONCERNS Current Concerns  None Noted   Other Concerns:    SOCIAL WORK ASSESSMENT / PLAN Clinical Social Worker met with patient and patient neice at bedside to offer support and discuss patient needs at discharge.  Patient states that he lives at home alone and has several friends coming by to check in on him.  Patient is very involved in the community and is hopeful to get back home and continue his community involvement.  Patient has made arrangements to stay with his neice in [place] who can provide 24 hour support until patient is independent enough to return home.  Patient neice is in full agreement with this plan.  Patient adamantly continues to refuse SNF placement.    CSW notified CM of patient plans and information to make arrangements for home health or outpatient therapies.  CSW to complete SBIRT at another time - patient transferring to new unit.  CSW remains available for support and to assist with discharge needs as needed.   Assessment/plan status:  Psychosocial Support/Ongoing Assessment of Needs Other assessment/ plan:   Information/referral to community resources:   Visual merchandiser received demographic information for patient plan  at discharge.  Additional resources will be provided as needed.    PATIENT'S/FAMILY'S RESPONSE TO PLAN OF CARE: Patient alert and oriented x3 sitting up in the chair. Patient in good spirits and very willing to engage in assessment process.  Patient with good relationship with his neice who plans to provide 24 hour support and assistance.  Patient is hopeful for return home alone at some point soon.  Patient understanding of CSW role and appreciative of support and understanding.

## 2013-08-27 DIAGNOSIS — N179 Acute kidney failure, unspecified: Secondary | ICD-10-CM

## 2013-08-27 LAB — BASIC METABOLIC PANEL
Chloride: 103 mEq/L (ref 96–112)
Creatinine, Ser: 1.69 mg/dL — ABNORMAL HIGH (ref 0.50–1.35)
GFR calc Af Amer: 39 mL/min — ABNORMAL LOW (ref >90)
GFR calc non Af Amer: 34 mL/min — ABNORMAL LOW (ref >90)
Potassium: 5.9 mEq/L — ABNORMAL HIGH (ref 3.5–5.1)

## 2013-08-27 LAB — URINALYSIS, ROUTINE W REFLEX MICROSCOPIC
Glucose, UA: NEGATIVE mg/dL
Ketones, ur: NEGATIVE mg/dL
Leukocytes, UA: NEGATIVE
Nitrite: NEGATIVE
Urobilinogen, UA: 0.2 mg/dL (ref 0.0–1.0)
pH: 5 (ref 5.0–8.0)

## 2013-08-27 MED ORDER — FUROSEMIDE 80 MG PO TABS
80.0000 mg | ORAL_TABLET | Freq: Two times a day (BID) | ORAL | Status: DC
  Administered 2013-08-27 – 2013-08-28 (×2): 80 mg via ORAL
  Filled 2013-08-27 (×4): qty 1

## 2013-08-27 MED ORDER — SODIUM POLYSTYRENE SULFONATE 15 GM/60ML PO SUSP
30.0000 g | Freq: Once | ORAL | Status: AC
  Administered 2013-08-27: 30 g via ORAL
  Filled 2013-08-27: qty 120

## 2013-08-27 NOTE — Progress Notes (Signed)
UR completed.  Pt to d/c home to [facility], [place] with his niece to provide 24hr care. She explained that in the past when there was a HH need for her mother, they could not get anyone to come out that far to provide services.  She stated she was happy to take patient for outpatient therapy if we could help her locate one. They will be in general vicinity of [place], [place], [place].  Address for d/c is [address] [place], [place] [address]. Phone is [phone]. Niece is Murvin Natal.  Carlyle Lipa, RN BSN MHA CCM Trauma/Neuro ICU Case Manager [phone]

## 2013-08-27 NOTE — Progress Notes (Signed)
Renal eval for non-oliguric AKI - mild. I spoke at length with his niece as well,  Patient examined and I agree with the assessment and plan  Violeta Gelinas, MD, MPH, FACS Pager: [phone]  08/27/2013 11:54 AM

## 2013-08-27 NOTE — Consult Note (Signed)
Calvin Rivas is an 79 y.o. male referred by Dr Janee Morn   Chief Complaint: CKD 4, hyperkalemia HPI: 79yo white male admitted 08/24/13 following a fall down an escalator and sustaining intraventricular bleed and left rib fx.  Sct 1.49 on admission and now 1.69. UO poor (or poorly recorded).   Pt lives in [place] and denies any hx of CKD though he is not the greatest historian. Unable to give me correct name of his PCP.  He has been on losartan, spironolactone and receiving IV fluids with KCL.  Past Medical History  Diagnosis Date  . Hypertension   . Hypercholesterolemia   . Reflux     History reviewed. No pertinent past surgical history.  No family history on file.  Neg for renal disease Social History:  reports that he has never smoked. He does not have any smokeless tobacco history on file. He reports that he does not drink alcohol or use illicit drugs. Lives in [place] by himself.  No children.  Allergies:  Allergies  Allergen Reactions  . Penicillins Rash and Other (See Comments)    Pt states dr told him if he took penicillin again "it could be the last time"    Medications Prior to Admission  Medication Sig Dispense Refill  . aspirin 81 MG chewable tablet Chew 81 mg by mouth daily.      Marland Kitchen CALCIUM PO Take 1 tablet by mouth daily.      Marland Kitchen doxycycline (VIBRAMYCIN) 100 MG capsule Take 100 mg by mouth 2 (two) times daily. 10 day course starting on 08/22/2013      . Fe Fum-FA-B Cmp-C-Zn-Mg-Mn-Cu (HEMATINIC PLUS VIT/MINERALS) 106-1 MG TABS Take 1 tablet by mouth daily.      Marland Kitchen losartan (COZAAR) 50 MG tablet Take 50 mg by mouth daily.      . meloxicam (MOBIC) 15 MG tablet Take 15 mg by mouth daily.      . simvastatin (ZOCOR) 40 MG tablet Take 40 mg by mouth every evening.      Marland Kitchen spironolactone (ALDACTONE) 25 MG tablet Take 25 mg by mouth daily.      . calcium carbonate (TUMS) 500 MG chewable tablet Chew 1 tablet by mouth daily.      . Vitamin D, Ergocalciferol, (DRISDOL) 50000 UNITS  CAPS capsule Take 50,000 Units by mouth every 7 (seven) days.         Lab Results: UA: ND   Recent Labs  08/25/13 0515 08/26/13 0425  WBC 8.4 8.0  HGB 10.2* 10.4*  HCT 30.9* 32.0*  PLT 157 121*   BMET  Recent Labs  08/25/13 0515 08/26/13 0425 08/27/13 0836  NA 140 136 136  K 5.0 5.4* 5.9*  CL 107 105 103  CO2 24 24 25   GLUCOSE 99 91 123*  BUN 36* 43* 47*  CREATININE 1.40* 1.61* 1.69*  CALCIUM 8.2* 8.2* 8.7   LFT No results found for this basename: PROT, ALBUMIN, AST, ALT, ALKPHOS, BILITOT, BILIDIR, IBILI,  in the last 72 hours Dg Chest Port 1 View  08/26/2013   CLINICAL DATA:  Subsequent encounter for pulmonary edema and multiple left rib fractures.  EXAM: PORTABLE CHEST - 1 VIEW  COMPARISON:  Portable examinations yesterday and 08/24/2013. Two-view chest x-ray 04/08/2012.  FINDINGS: Multiple left-sided rib fractures as noted previously. Cardiac silhouette markedly enlarged but stable. Diffuse interstitial and airspace pulmonary edema, unchanged. Moderate-sized left pleural effusion and/or hemothorax, unchanged, with associated dense consolidation in the left lower lobe. No new pulmonary parenchymal abnormalities.  IMPRESSION: 1. Stable moderate CHF with diffuse interstitial and airspace pulmonary edema. 2. Stable left pleural effusion and/or hemothorax related to multiple left rib fractures. 3. Stable dense passive atelectasis and/or pneumonia in the left lower lobe. 4. No new abnormalities.   Electronically Signed   By: Hulan Saas M.D.   On: 08/26/2013 07:28    ROS: No change in vision + pain lt chest No SOB No ABD pain Chronic pain Rt knee No neuropathic Sxs No dysuria  PHYSICAL EXAM: Blood pressure 146/63, pulse 93, temperature 97.7 F (36.5 C), temperature source Oral, resp. rate 20, height 5\' 7"  (1.702 m), weight 65.545 kg (144 lb 8 oz), SpO2 96.00%. HEENT: PERRLA EOMI NECK:mild JVD LUNGS:Bil crackles CARDIAC:RRR with S4 gallop ABD:+ BS NT.  Mild  suprapubic fullness EXT:tr-1+ edema NEURO:CNI OX3 M&SI no asterixis  Assessment: 1. Presumably CKD 3 that I suspect would most likely be related to nephrosclerosis from HTN and AGE.  He has some suprapubic fullness which raises the ? of urinary retention 2. HTN 3. Hyperkalemia sec CKD.ARB,spironolactone, and IV KCL 4. Intravent bleed 5. Lt rib fx PLAN: 1. Place foley 2. PO lasix 3. PO kayexalate 4. DC losartan and spironolactone.  I would never put him back on ACE or ARB 5. Renal diet for now 6. Renal US 7. Daily Scr and K 8. UA and SPEP   Daichi Moris T 08/27/2013, 3:36 PM

## 2013-08-27 NOTE — Progress Notes (Signed)
Physical Therapy Treatment Patient Details Name: Sabastien Rivas MRN: [id number] DOB: 1921-06-10 Today's Date: 08/27/2013 Time: 4401-0272 PT Time Calculation (min): 30 min  PT Assessment / Plan / Recommendation  History of Present Illness Pt s/p fall off escalator 2 days ago presenting with chest/rib pain.   PT Comments   Focus of treatment on family education with niece present and assisting patient throughout.  States railing on steps at house pt will stay to be installed Saturday so demonstrated how to negotiate with walker with reverse technique.  Will need HHPT versus outpatient PT with niece exploring options in the area.  Follow Up Recommendations  Home health PT;Supervision/Assistance - 24 hour;Outpatient PT (depending on what is available in area, niece checking)           Equipment Recommendations  Rolling walker with 5" wheels;3in1 (PT)       Frequency Min 4X/week   Progress towards PT Goals Progress towards PT goals: Progressing toward goals  Plan Current plan remains appropriate    Precautions / Restrictions Precautions Precautions: Fall   Pertinent Vitals/Pain C/o rib pain with transfers and left LE pain with ambulation; not rated    Mobility  Bed Mobility Bed Mobility: Not assessed Details for Bed Mobility Assistance: Pt seated in chair upon arrival. Transfers Sit to Stand: 4: Min assist;With upper extremity assist;From chair/3-in-1 Stand to Sit: 4: Min assist;To chair/3-in-1;With upper extremity assist Details for Transfer Assistance: cues for scooting out and back and assist for lifting up to stand (increased rib pain with pushing up to stand) Ambulation/Gait Ambulation/Gait Assistance: 4: Min assist Ambulation Distance (Feet): 60 Feet Assistive device: Rolling walker Ambulation/Gait Assistance Details: c/o left LE pain with ambulation; neice present and educated how to assist pt and she performed assistance with pt during ambulation Gait Pattern: Trunk  flexed;Trunk rotated posteriorly on left;Step-to pattern;Decreased stride length;Antalgic;Shuffle Stairs Assistance: 1: +2 Total assist Stairs Assistance Details (indicate cue type and reason): mod assist for stepping up backwards and neice holding walker education with neice on sequence and technique to assist pt; pt with posterior loss of balance at times needing up to 50% assist Stair Management Technique: Step to pattern;Backwards;With walker      PT Goals (current goals can now be found in the care plan section)    Visit Information  Last PT Received On: 08/27/13 Assistance Needed: +1 History of Present Illness: Pt s/p fall off escalator 2 days ago presenting with chest/rib pain.    Subjective Data      Cognition  Cognition Arousal/Alertness: Awake/alert Behavior During Therapy: WFL for tasks assessed/performed Overall Cognitive Status: Within Functional Limits for tasks assessed    Balance     End of Session PT - End of Session Equipment Utilized During Treatment: Gait belt Activity Tolerance: Patient limited by fatigue;Patient limited by pain Patient left: in chair;with call bell/phone within reach   GP     [facility] 08/27/2013, 4:28 PM Calvin Rivas, Calvin Rivas [phone] 08/27/2013

## 2013-08-27 NOTE — Progress Notes (Signed)
Patient ID: Calvin Rivas, male   DOB: Nov 15, 1920, 79 y.o.   MRN: [id number]   LOS: 3 days   Subjective: No new c/o.   Objective: Vital signs in last 24 hours: Temp:  [97.1 F (36.2 C)-98.1 F (36.7 C)] 97.8 F (36.6 C) (11/20 0607) Pulse Rate:  [65-93] 89 (11/20 0607) Resp:  [12-20] 20 (11/20 0607) BP: (102-123)/(60-62) 123/61 mmHg (11/20 0607) SpO2:  [91 %-98 %] 98 % (11/20 0607) Weight:  [144 lb 8 oz (65.545 kg)] 144 lb 8 oz (65.545 kg) (11/19 1518) Last BM Date: 08/26/13   IS:   Laboratory  BMET  Recent Labs  08/25/13 0515 08/26/13 0425  NA 140 136  K 5.0 5.4*  CL 107 105  CO2 24 24  GLUCOSE 99 91  BUN 36* 43*  CREATININE 1.40* 1.61*  CALCIUM 8.2* 8.2*    Physical Exam General appearance: alert and no distress Resp: clear to auscultation bilaterally Cardio: regular rate and rhythm GI: normal findings: bowel sounds normal and soft, non-tender   Assessment/Plan: Fall  Intraventricular hemorrhage - hold off on repeat CT unless neuro changes, local wound care to scalp abrasion  Left rib fx - pulm toilet  ABL anemia - Stable CKD -- Check today Multiple medical problems - on home meds  FEN - reg diet, red IVF to 49mL/hour to avoid fluid overload, bowel regimen  VTE - SCD's Dispo -- Home with niece unless renal fxn has not stabilized, then renal consult    Freeman Caldron, PA-C Pager: [phone] General Trauma PA Pager: [phone]   08/27/2013

## 2013-08-28 ENCOUNTER — Inpatient Hospital Stay (HOSPITAL_COMMUNITY): Payer: Medicare Other

## 2013-08-28 LAB — RENAL FUNCTION PANEL
Albumin: 3 g/dL — ABNORMAL LOW (ref 3.5–5.2)
BUN: 40 mg/dL — ABNORMAL HIGH (ref 6–23)
CO2: 28 mEq/L (ref 19–32)
Calcium: 8.9 mg/dL (ref 8.4–10.5)
Chloride: 104 mEq/L (ref 96–112)
GFR calc Af Amer: 47 mL/min — ABNORMAL LOW (ref >90)
GFR calc non Af Amer: 41 mL/min — ABNORMAL LOW (ref >90)
Phosphorus: 3.2 mg/dL (ref 2.3–4.6)
Sodium: 140 mEq/L (ref 135–145)

## 2013-08-28 LAB — IRON AND TIBC
TIBC: 223 ug/dL (ref 215–435)
UIBC: 200 ug/dL (ref 125–400)

## 2013-08-28 MED ORDER — TRAMADOL HCL 50 MG PO TABS: 50.0000 mg | ORAL_TABLET | Freq: Four times a day (QID) | ORAL | Status: DC | PRN

## 2013-08-28 MED ORDER — SIMVASTATIN 40 MG PO TABS: 40.0000 mg | ORAL_TABLET | Freq: Every evening | ORAL | Status: AC

## 2013-08-28 MED ORDER — DOXYCYCLINE HYCLATE 100 MG PO CAPS: 100.0000 mg | ORAL_CAPSULE | Freq: Two times a day (BID) | ORAL | Status: DC

## 2013-08-28 NOTE — Progress Notes (Signed)
Pt with DME orders for rolling walker and 3-in-1 BSC. Spoke with pt who says that he already has a Rollator that Medicare paid for, but he does wish to have the [facility]. It was delivered to room by [facility] while I was speaking with pt. Outpt therapy locations written down and placed in drawer with pt's discharge instructions and RN updated on plan.

## 2013-08-28 NOTE — Discharge Summary (Signed)
Patient looks great and should be fine at home.  This patient has been seen and I agree with the findings and treatment plan.  Marta Lamas. Gae Bon, MD, FACS [phone] (pager) [phone] (direct pager) Trauma Surgeon

## 2013-08-28 NOTE — Progress Notes (Signed)
Subjective: Interval History: has complaints not getting nursing service.  Objective: Vital signs in last 24 hours: Temp:  [97.2 F (36.2 C)-98.2 F (36.8 C)] 98.2 F (36.8 C) (11/21 0535) Pulse Rate:  [86-93] 86 (11/21 0535) Resp:  [16-20] 18 (11/21 0535) BP: (109-149)/(52-71) 109/52 mmHg (11/21 0535) SpO2:  [95 %-97 %] 95 % (11/21 0535) Weight change:   Intake/Output from previous day: 11/20 0701 - 11/21 0700 In: 240 [P.O.:240] Out: 3675 [Urine:3675] Intake/Output this shift:    General appearance: alert, cooperative and pale Resp: diminished breath sounds bilaterally, rales bibasilar and poor expansion Cardio: regular rate and rhythm and systolic murmur: systolic ejection 2/6, decrescendo at 2nd left intercostal space GI: pos bs, nontender, liver down 4 cm Extremities: extremities normal, atraumatic, no cyanosis or edema  Lab Results:  Recent Labs  08/26/13 0425  WBC 8.0  HGB 10.4*  HCT 32.0*  PLT 121*   BMET:  Recent Labs  08/27/13 0836 08/28/13 0609  NA 136 140  K 5.9* 4.9  CL 103 104  CO2 25 28  GLUCOSE 123* 95  BUN 47* 40*  CREATININE 1.69* 1.45*  CALCIUM 8.7 8.9   No results found for this basename: PTH,  in the last 72 hours Iron Studies: No results found for this basename: IRON, TIBC, TRANSFERRIN, FERRITIN,  in the last 72 hours  Studies/Results: US Renal  08/28/2013   CLINICAL DATA:  Chronic kidney disease.  EXAM: RENAL/URINARY TRACT ULTRASOUND COMPLETE  COMPARISON:  None.  FINDINGS: Right Kidney  Length: 11.7 cm. Caliectasis in the upper pole versus an atypical parapelvic cyst extending to the periphery. Renal parenchyma is echogenic consistent with renal medical disease.  Left Kidney  Length: 11.8 cm.  Echogenic renal parenchyma.  Bladder  The bladder is empty with a Foley catheter in place.  IMPRESSION: Echogenic renal parenchyma bilaterally consistent renal medical disease. No hydronephrosis. Right upper pole caliectasis versus atypical  peripelvic cyst.   Electronically Signed   By: Geanie Cooley M.D.   On: 08/28/2013 07:18    I have reviewed the patient's current medications.  Assessment/Plan: 1 AKI/CKD CR improved, suspect close to baseline.  K ok, acid base ok.  Diuresing with foley/diuretics 2 anmia check Fe 3 HPTH check 4 Trauma per primary P avoid spironolactone, ARB, can d/c hom anytime from our perspective    LOS: 4 days   Carolin Quang L 08/28/2013,11:50 AM

## 2013-08-28 NOTE — Progress Notes (Addendum)
Patient ID: Calvin Rivas, male   DOB: December 24, 1920, 79 y.o.   MRN: [id number]   LOS: 4 days   Subjective: No new c/o.   Objective: Vital signs in last 24 hours: Temp:  [97.2 F (36.2 C)-98.2 F (36.8 C)] 98.2 F (36.8 C) (11/21 0535) Pulse Rate:  [86-93] 86 (11/21 0535) Resp:  [16-20] 18 (11/21 0535) BP: (109-149)/(52-71) 109/52 mmHg (11/21 0535) SpO2:  [95 %-97 %] 95 % (11/21 0535) Last BM Date: 08/27/13   UOP: 231ml/h   IS: (+255ml)   Laboratory  BMET  Recent Labs  08/26/13 0425 08/27/13 0836  NA 136 136  K 5.4* 5.9*  CL 105 103  CO2 24 25  GLUCOSE 91 123*  BUN 43* 47*  CREATININE 1.61* 1.69*  CALCIUM 8.2* 8.7   Urinalysis    Component Value Date/Time   COLORURINE YELLOW 08/27/2013 1649   APPEARANCEUR CLEAR 08/27/2013 1649   LABSPEC 1.017 08/27/2013 1649   PHURINE 5.0 08/27/2013 1649   GLUCOSEU NEGATIVE 08/27/2013 1649   HGBUR NEGATIVE 08/27/2013 1649   BILIRUBINUR NEGATIVE 08/27/2013 1649   KETONESUR NEGATIVE 08/27/2013 1649   PROTEINUR NEGATIVE 08/27/2013 1649   UROBILINOGEN 0.2 08/27/2013 1649   NITRITE NEGATIVE 08/27/2013 1649   LEUKOCYTESUR NEGATIVE 08/27/2013 1649    Radiology Results RENAL/URINARY TRACT ULTRASOUND COMPLETE  COMPARISON: None.  FINDINGS:  Right Kidney  Length: 11.7 cm. Caliectasis in the upper pole versus an atypical  parapelvic cyst extending to the periphery. Renal parenchyma is  echogenic consistent with renal medical disease.  Left Kidney  Length: 11.8 cm. Echogenic renal parenchyma.  Bladder  The bladder is empty with a Foley catheter in place.  IMPRESSION:  Echogenic renal parenchyma bilaterally consistent renal medical  disease. No hydronephrosis. Right upper pole caliectasis versus  atypical peripelvic cyst.  Electronically Signed  By: Geanie Cooley M.D.  On: 08/28/2013 07:18   Physical Exam General appearance: alert and no distress Resp: clear to auscultation bilaterally Cardio: regular rate and  rhythm GI: normal findings: bowel sounds normal and soft, non-tender   Assessment/Plan: Fall  Intraventricular hemorrhage - hold off on repeat CT unless neuro changes, local wound care to scalp abrasion  Left rib fx - pulm toilet  ABL anemia - Stable  CKD -- Today's labs pending, appreciate renal consult  Multiple medical problems - on home meds  FEN - No issues VTE - SCD's  Dispo -- Renal    Freeman Caldron, PA-C Pager: [phone] General Trauma PA Pager: [phone]   08/28/2013    Patient is fine and hopefully should be able to go home some.  Renal function is better.  This patient has been seen and I agree with the findings and treatment plan.  Marta Lamas. Gae Bon, MD, FACS [phone] (pager) [phone] (direct pager) Trauma Surgeon

## 2013-08-28 NOTE — Clinical Social Work Note (Signed)
Clinical Social Worker continuing to follow for support and discharge planning needs.  CSW inquired about patient current substance use.  Patient states that he has not smoked a cigarette since 1933 and has not a drink of alcohol since 1945.  Patient with no concerns regarding substance use.  Patient still plans to return home with his niece and 24 hour support at discharge.  Clinical Social Worker will sign off for now as social work intervention is no longer needed. Please consult [place] again if new need arises.  Macario Golds, [place] [phone]

## 2013-08-28 NOTE — Progress Notes (Signed)
Physical Therapy Treatment Patient Details Name: Calvin Rivas MRN: [id number] DOB: 07-Jul-1921 Today's Date: 08/28/2013 Time: 0865-7846 PT Time Calculation (min): 26 min  PT Assessment / Plan / Recommendation  History of Present Illness Pt s/p fall off escalator 2 days ago presenting with chest/rib pain.   PT Comments   Pt able to increase ambulation distance today on room air with o2 level 91%.  Discussed at length with pt that he needed his niece to be with him at first for 24 hour supervision until his balance and strength improved.  Pt verbalized understanding.  Niece not present for continued stair training which was initiated last treatment.  Follow Up Recommendations  Home health PT;Supervision/Assistance - 24 hour;Outpatient PT     Does the patient have the potential to tolerate intense rehabilitation     Barriers to Discharge        Equipment Recommendations  Rolling walker with 5" wheels;3in1 (PT)    Recommendations for Other Services    Frequency Min 4X/week   Progress towards PT Goals Progress towards PT goals: Progressing toward goals  Plan Current plan remains appropriate    Precautions / Restrictions Precautions Precautions: Fall   Pertinent Vitals/Pain C/o soreness from rib fracture-unable to rate.    Mobility  Bed Mobility Details for Bed Mobility Assistance: Pt seated in chair upon arrival. Transfers Sit to Stand: 4: Min assist;From chair/3-in-1 Stand to Sit: 4: Min assist;To chair/3-in-1 (cues to keep RW with him til he is front of chair) Ambulation/Gait Ambulation/Gait Assistance: 4: Min guard Ambulation Distance (Feet): 180 Feet Assistive device: Rolling walker Ambulation/Gait Assistance Details: no c/o pain today with gait.  o2 91% on room air Gait Pattern: Decreased step length - right;Decreased step length - left Gait velocity: decreased General Gait Details: guarded gait Stairs:  (niece not present for continued training)    Exercises      PT Diagnosis:    PT Problem List:   PT Treatment Interventions:     PT Goals (current goals can now be found in the care plan section) Acute Rehab PT Goals Patient Stated Goal: home  Visit Information  Last PT Received On: 08/28/13 Assistance Needed: +1 History of Present Illness: Pt s/p fall off escalator 2 days ago presenting with chest/rib pain.    Subjective Data  Subjective: Pt joking throughout treatment. Patient Stated Goal: home   Cognition  Cognition Arousal/Alertness: Awake/alert Behavior During Therapy: WFL for tasks assessed/performed Overall Cognitive Status: Within Functional Limits for tasks assessed    Balance     End of Session PT - End of Session Equipment Utilized During Treatment: Gait belt Activity Tolerance: Patient tolerated treatment well Patient left: in chair;with call bell/phone within reach Nurse Communication: Mobility status   GP     Calvin Rivas LUBECK 08/28/2013, 11:47 AM

## 2013-08-28 NOTE — Progress Notes (Signed)
Patient discharged to home with instructions given to his niece.

## 2013-08-28 NOTE — Discharge Summary (Signed)
Physician Discharge Summary  Patient ID: Calvin Rivas MRN: [id number] DOB/AGE: 1921/02/02 79 y.o.  Admit date: 08/24/2013 Discharge date: 08/28/2013  Discharge Diagnoses Patient Active Problem List   Diagnosis Date Noted  . Fall 08/25/2013  . Multiple fractures of ribs of left side 08/25/2013  . Traumatic subarachnoid hemorrhage 08/25/2013  . Traumatic intraventricular hemorrhage, grade I 08/25/2013  . Acute blood loss anemia 08/25/2013  . Acute kidney injury 08/25/2013  . Hypertension   . Hypercholesterolemia   . Reflux     Consultants Dr. Primitivo Gauze for nephrology   Procedures None   HPI: Calvin Rivas fell down an escalator 3 days prior to admission. He states he was shopping and was going up the escalator when his cane got stuck and caused him to fall and roll several times down the escalator. He fell backwards striking the back of his head and his left side. He denies loss of consciousness following the fall. CT scans of the head and chest showed the above-mentioned injuries. He was admitted for pain control and pulmonary toilet. Of note, his creatinine was mildly elevated on his admission labs.   Hospital Course: As the patient was neurologically intact it was not felt to be helpful to have neurosurgery consult or repeat the head CT. He did well from a respiratory standpoint as well and was able to be mobilized with physical and occupational therapies. His pain was controlled on oral medications. His creatinine continued to rise slowly while he was here and he became mildly hyperkalemic. Nephrology was consulted and recommended cessation of his ARB and spironolactone. His renal function improved to better than his initial lab values and he was determined stable to discharge home in the care of his niece who could provide 24-hour assistance.      Medication List    STOP taking these medications       aspirin 81 MG chewable tablet     losartan 50 MG tablet  Commonly  known as:  COZAAR     meloxicam 15 MG tablet  Commonly known as:  MOBIC     spironolactone 25 MG tablet  Commonly known as:  ALDACTONE      TAKE these medications       CALCIUM PO  Take 1 tablet by mouth daily.     doxycycline 100 MG capsule  Commonly known as:  VIBRAMYCIN  Take 1 capsule (100 mg total) by mouth 2 (two) times daily. 10 day course starting on 08/22/2013     HEMATINIC PLUS VIT/MINERALS 106-1 MG Tabs  Take 1 tablet by mouth daily.     simvastatin 40 MG tablet  Commonly known as:  ZOCOR  Take 1 tablet (40 mg total) by mouth every evening.     traMADol 50 MG tablet  Commonly known as:  ULTRAM  Take 1-2 tablets (50-100 mg total) by mouth every 6 (six) hours as needed (Pain).     TUMS 500 MG chewable tablet  Generic drug:  calcium carbonate  Chew 1 tablet by mouth daily.     Vitamin D (Ergocalciferol) 50000 UNITS Caps capsule  Commonly known as:  DRISDOL  Take 50,000 Units by mouth every 7 (seven) days.             Follow-up Information   Follow up with Primary care provider. Schedule an appointment as soon as possible for a visit in 1 month.      Call [facility]. (As needed)    Contact information:  [address] [place] [address] [phone]       Signed: Freeman Caldron, PA-C Pager: [phone] General Trauma PA Pager: [phone]  08/28/2013, 3:34 PM

## 2013-08-31 LAB — PROTEIN ELECTROPHORESIS, SERUM
Alpha-1-Globulin: 8.7 % — ABNORMAL HIGH (ref 2.9–4.9)
Beta 2: 6.3 % (ref 3.2–6.5)
Gamma Globulin: 15.5 % (ref 11.1–18.8)
M-Spike, %: NOT DETECTED g/dL

## 2013-08-31 LAB — PARATHYROID HORMONE, INTACT (NO CA): PTH: 84.7 pg/mL — ABNORMAL HIGH (ref 14.0–72.0)

## 2013-09-02 NOTE — Progress Notes (Signed)
Physical Therapy Treatment Patient Details Name: Calvin Rivas MRN: [id number] DOB: 05-25-1921 Today's Date: 08/26/2013 Time: 1308-6578 PT Time Calculation (min): 38 min  PT Assessment / Plan / Recommendation  History of Present Illness Pt s/p fall off escalator 2 days ago presenting with chest/rib pain.   PT Comments   Patient tolerated mobility including stair training well with report of mild fatigue and LE pain afterwards. Continues to show deficits in strength during transfers and continues to require assistance with safe use of walker during ambulation. Pt continues to be a high fall risk and requires 24/7 supervision for safe d/c. Niece was present in the room and states she is available to assist him in his home as long as needed.  Follow Up Recommendations  Home health PT;Supervision/Assistance - 24 hour     Does the patient have the potential to tolerate intense rehabilitation     Barriers to Discharge        Equipment Recommendations  Rolling walker with 5" wheels;3in1 (PT)    Recommendations for Other Services    Frequency Min 4X/week   Progress towards PT Goals Progress towards PT goals: Progressing toward goals  Plan Current plan remains appropriate    Precautions / Restrictions Precautions Precautions: Fall Restrictions Weight Bearing Restrictions: No   Pertinent Vitals/Pain SpO2 on RA 89%, dropped to 84% on RA with activity; 98% on 2L with activity, seated following activity, 92% on RA.    Mobility  Bed Mobility Bed Mobility: Not assessed Details for Bed Mobility Assistance: Pt seated in chair upon arrival. Transfers Transfers: Sit to Stand;Stand to Sit Sit to Stand: 4: Min assist;With upper extremity assist;From chair/3-in-1 Stand to Sit: 4: Min assist;To chair/3-in-1;With upper extremity assist Details for Transfer Assistance: Assist needed for anterior weight shift  Ambulation/Gait Ambulation/Gait Assistance: 4: Min assist Ambulation Distance (Feet):  75 Feet Assistive device: Rolling walker Ambulation/Gait Assistance Details: V/c to stay inside RW, pt with increased trunk flexion Gait Pattern: Step-through pattern;Decreased stride length;Trunk flexed Gait velocity: decreased General Gait Details: noted LLE limp; pt reports both LE sore and stiff; Stairs: Yes Stairs Assistance: 3: Mod assist Stairs Assistance Details (indicate cue type and reason): required modassist to correct excess weight shift to R; v/c for safe hand placement and LE sequencing Stair Management Technique: Step to pattern;One rail Right (used handheld assist throughout; R rail up, L rail down;) Number of Stairs: 3    Exercises     PT Diagnosis:    PT Problem List:   PT Treatment Interventions:     PT Goals (current goals can now be found in the care plan section) Acute Rehab PT Goals Patient Stated Goal: home  Visit Information  Last PT Received On: 08/26/13 Assistance Needed: +1 History of Present Illness: Pt s/p fall off escalator 2 days ago presenting with chest/rib pain.    Subjective Data  Patient Stated Goal: home   Cognition  Cognition Arousal/Alertness: Awake/alert Behavior During Therapy: WFL for tasks assessed/performed Overall Cognitive Status: Within Functional Limits for tasks assessed    Balance     End of Session PT - End of Session Equipment Utilized During Treatment: Gait belt Activity Tolerance: Patient limited by fatigue;Patient limited by pain Patient left: in chair;with call bell/phone within reach Nurse Communication: Mobility status   GP     Willette Pa, SPT 08/26/2013, 11:45 AM  Lewis Shock, PT, DPT Pager #: [phone] Office #: [phone]

## 2013-11-26 ENCOUNTER — Emergency Department (HOSPITAL_COMMUNITY): Payer: Medicare Other

## 2013-11-26 ENCOUNTER — Inpatient Hospital Stay (HOSPITAL_COMMUNITY)
Admission: EM | Admit: 2013-11-26 | Discharge: 2013-11-30 | DRG: 292 | Disposition: A | Discharge status: Home / Self Care | Payer: Medicare Other | Attending: Internal Medicine | Admitting: Internal Medicine

## 2013-11-26 ENCOUNTER — Encounter (HOSPITAL_COMMUNITY): Payer: Self-pay | Admitting: Emergency Medicine

## 2013-11-26 DIAGNOSIS — I509 Heart failure, unspecified: Secondary | ICD-10-CM

## 2013-11-26 DIAGNOSIS — I255 Ischemic cardiomyopathy: Secondary | ICD-10-CM

## 2013-11-26 DIAGNOSIS — N179 Acute kidney failure, unspecified: Secondary | ICD-10-CM

## 2013-11-26 DIAGNOSIS — K219 Gastro-esophageal reflux disease without esophagitis: Secondary | ICD-10-CM | POA: Diagnosis present

## 2013-11-26 DIAGNOSIS — I5189 Other ill-defined heart diseases: Secondary | ICD-10-CM

## 2013-11-26 DIAGNOSIS — D696 Thrombocytopenia, unspecified: Secondary | ICD-10-CM

## 2013-11-26 DIAGNOSIS — N183 Chronic kidney disease, stage 3 unspecified: Secondary | ICD-10-CM

## 2013-11-26 DIAGNOSIS — IMO0001 Reserved for inherently not codable concepts without codable children: Secondary | ICD-10-CM | POA: Diagnosis present

## 2013-11-26 DIAGNOSIS — E860 Dehydration: Secondary | ICD-10-CM

## 2013-11-26 DIAGNOSIS — I428 Other cardiomyopathies: Secondary | ICD-10-CM | POA: Diagnosis present

## 2013-11-26 DIAGNOSIS — E7800 Pure hypercholesterolemia, unspecified: Secondary | ICD-10-CM

## 2013-11-26 DIAGNOSIS — I10 Essential (primary) hypertension: Secondary | ICD-10-CM

## 2013-11-26 DIAGNOSIS — I129 Hypertensive chronic kidney disease with stage 1 through stage 4 chronic kidney disease, or unspecified chronic kidney disease: Secondary | ICD-10-CM | POA: Diagnosis present

## 2013-11-26 DIAGNOSIS — I429 Cardiomyopathy, unspecified: Secondary | ICD-10-CM

## 2013-11-26 DIAGNOSIS — R600 Localized edema: Secondary | ICD-10-CM | POA: Diagnosis present

## 2013-11-26 DIAGNOSIS — Z9181 History of falling: Secondary | ICD-10-CM

## 2013-11-26 DIAGNOSIS — I519 Heart disease, unspecified: Secondary | ICD-10-CM

## 2013-11-26 DIAGNOSIS — E870 Hyperosmolality and hypernatremia: Secondary | ICD-10-CM

## 2013-11-26 DIAGNOSIS — I5041 Acute combined systolic (congestive) and diastolic (congestive) heart failure: Principal | ICD-10-CM

## 2013-11-26 LAB — URINALYSIS, ROUTINE W REFLEX MICROSCOPIC
Bilirubin Urine: NEGATIVE
Glucose, UA: NEGATIVE mg/dL
Hgb urine dipstick: NEGATIVE
Ketones, ur: NEGATIVE mg/dL
NITRITE: NEGATIVE
Protein, ur: 30 mg/dL — AB
UROBILINOGEN UA: 0.2 mg/dL (ref 0.0–1.0)
pH: 5.5 (ref 5.0–8.0)

## 2013-11-26 LAB — CBC WITH DIFFERENTIAL/PLATELET
BASOS ABS: 0.1 10*3/uL (ref 0.0–0.1)
BASOS PCT: 1 % (ref 0–1)
Eosinophils Absolute: 0.2 10*3/uL (ref 0.0–0.7)
Eosinophils Relative: 2 % (ref 0–5)
HCT: 45.1 % (ref 39.0–52.0)
Hemoglobin: 14.3 g/dL (ref 13.0–17.0)
Lymphocytes Relative: 17 % (ref 12–46)
Lymphs Abs: 1.2 10*3/uL (ref 0.7–4.0)
MCH: 32.1 pg (ref 26.0–34.0)
MCHC: 31.7 g/dL (ref 30.0–36.0)
MCV: 101.3 fL — ABNORMAL HIGH (ref 78.0–100.0)
MONO ABS: 0.7 10*3/uL (ref 0.1–1.0)
Monocytes Relative: 9 % (ref 3–12)
NEUTROS ABS: 5.1 10*3/uL (ref 1.7–7.7)
Neutrophils Relative %: 71 % (ref 43–77)
PLATELETS: 126 10*3/uL — AB (ref 150–400)
RBC: 4.45 MIL/uL (ref 4.22–5.81)
RDW: 15.8 % — AB (ref 11.5–15.5)
WBC: 7.2 10*3/uL (ref 4.0–10.5)

## 2013-11-26 LAB — COMPREHENSIVE METABOLIC PANEL
ALBUMIN: 3.8 g/dL (ref 3.5–5.2)
ALT: 23 U/L (ref 0–53)
AST: 34 U/L (ref 0–37)
Alkaline Phosphatase: 113 U/L (ref 39–117)
BUN: 28 mg/dL — ABNORMAL HIGH (ref 6–23)
CO2: 29 mEq/L (ref 19–32)
Calcium: 9.7 mg/dL (ref 8.4–10.5)
Chloride: 105 mEq/L (ref 96–112)
Creatinine, Ser: 1.64 mg/dL — ABNORMAL HIGH (ref 0.50–1.35)
GFR calc non Af Amer: 35 mL/min — ABNORMAL LOW (ref >90)
GFR, EST AFRICAN AMERICAN: 41 mL/min — AB (ref >90)
GLUCOSE: 122 mg/dL — AB (ref 70–99)
POTASSIUM: 4.8 meq/L (ref 3.7–5.3)
Sodium: 148 mEq/L — ABNORMAL HIGH (ref 137–147)
TOTAL PROTEIN: 7.8 g/dL (ref 6.0–8.3)
Total Bilirubin: 0.7 mg/dL (ref 0.3–1.2)

## 2013-11-26 LAB — PROTIME-INR
INR: 1.16 (ref 0.00–1.49)
Prothrombin Time: 14.6 seconds (ref 11.6–15.2)

## 2013-11-26 LAB — URINE MICROSCOPIC-ADD ON

## 2013-11-26 LAB — PRO B NATRIURETIC PEPTIDE: Pro B Natriuretic peptide (BNP): 17280 pg/mL — ABNORMAL HIGH (ref 0–450)

## 2013-11-26 LAB — APTT: aPTT: 30 seconds (ref 24–37)

## 2013-11-26 LAB — TROPONIN I: Troponin I: <0.3 ng/mL (ref <0.30)

## 2013-11-26 MED ORDER — ALBUTEROL SULFATE (2.5 MG/3ML) 0.083% IN NEBU: 2.5000 mg | INHALATION_SOLUTION | RESPIRATORY_TRACT | Status: DC | PRN

## 2013-11-26 MED ORDER — ACETAMINOPHEN 650 MG RE SUPP: 650.0000 mg | Freq: Four times a day (QID) | RECTAL | Status: DC | PRN

## 2013-11-26 MED ORDER — FUROSEMIDE 10 MG/ML IJ SOLN
40.0000 mg | Freq: Once | INTRAMUSCULAR | Status: AC
  Administered 2013-11-26: 40 mg via INTRAVENOUS
  Filled 2013-11-26: qty 4

## 2013-11-26 MED ORDER — ONDANSETRON HCL 4 MG PO TABS: 4.0000 mg | ORAL_TABLET | Freq: Four times a day (QID) | ORAL | Status: DC | PRN

## 2013-11-26 MED ORDER — FUROSEMIDE 10 MG/ML IJ SOLN
40.0000 mg | Freq: Every day | INTRAMUSCULAR | Status: DC
Start: 2013-11-27 — End: 2013-11-29
  Administered 2013-11-27 – 2013-11-28 (×2): 40 mg via INTRAVENOUS
  Filled 2013-11-26 (×2): qty 4

## 2013-11-26 MED ORDER — HEMATINIC PLUS VIT/MINERALS 106-1 MG PO TABS: 1.0000 | ORAL_TABLET | Freq: Every day | ORAL | Status: DC

## 2013-11-26 MED ORDER — ACETAMINOPHEN 325 MG PO TABS
650.0000 mg | ORAL_TABLET | Freq: Four times a day (QID) | ORAL | Status: DC | PRN
Start: 2013-11-26 — End: 2013-11-30

## 2013-11-26 MED ORDER — CALCIUM CARBONATE ANTACID 500 MG PO CHEW
1.0000 | CHEWABLE_TABLET | Freq: Every day | ORAL | Status: DC
  Administered 2013-11-26 – 2013-11-30 (×5): 200 mg via ORAL
  Filled 2013-11-26 (×5): qty 1

## 2013-11-26 MED ORDER — SIMVASTATIN 20 MG PO TABS
40.0000 mg | ORAL_TABLET | Freq: Every evening | ORAL | Status: DC
  Administered 2013-11-26 – 2013-11-29 (×4): 40 mg via ORAL
  Filled 2013-11-26 (×4): qty 2

## 2013-11-26 MED ORDER — ONDANSETRON HCL 4 MG/2ML IJ SOLN: 4.0000 mg | Freq: Four times a day (QID) | INTRAMUSCULAR | Status: DC | PRN

## 2013-11-26 MED ORDER — SODIUM CHLORIDE 0.9 % IJ SOLN
3.0000 mL | Freq: Two times a day (BID) | INTRAMUSCULAR | Status: DC
  Administered 2013-11-26 – 2013-11-30 (×8): 3 mL via INTRAVENOUS

## 2013-11-26 MED ORDER — METOPROLOL SUCCINATE ER 25 MG PO TB24
25.0000 mg | ORAL_TABLET | Freq: Every day | ORAL | Status: DC
  Administered 2013-11-26 – 2013-11-29 (×4): 25 mg via ORAL
  Filled 2013-11-26 (×4): qty 1

## 2013-11-26 NOTE — H&P (Signed)
History and Physical  Bader Stubblefield ZOX:[id number] DOB: January 03, 1921 DOA: 11/26/2013  Referring physician: EDP PCP: Glori Bickers, MD  Outpatient Specialists:  1. None  Chief Complaint: Bilateral leg swelling and dyspnea on exertion  HPI: Calvin Rivas is a 80 y.o. male with history of hypertension, hypercholesterolemia, reflux, denies cardiac history, prior mechanical fall associated with multiple left-sided rib fractures, traumatic subarachnoid hemorrhage, acute blood loss anemia and acute kidney injury, presented to the ED on 11/26/13 with complaints of bilateral leg swelling and dyspnea on exertion. Patient states that up to a month ago, he was able to ambulate a large [employer] in his town, go to skilled nursing facility for visiting and ambulated without difficulty. For the last 10 days, he has noticed gradually worsening bilateral leg swelling without pain and associated dyspnea on exertion. He denies orthopnea, PND, chest pain, cough, palpitations, dizziness or lightheadedness. He denies unsteady gait or recent falls. She states that some of the medications that he was taking in the past were stopped during. He also states that he had been seen by a cardiologist in the past who cleared him stating that he had no cardiac issues. In the ED, sodium 148, creatinine 1.64, platelets 126, negative troponin and proBNP 17280 and chest x-ray suggestive of pulmonary edema. Hospitalist admission requested. Patient states that his appetite has generally reduced but denies nausea, vomiting, diarrhea, fever or chills.   Review of Systems: All systems reviewed and apart from history of presenting illness, are negative.  Past Medical History  Diagnosis Date  . Hypertension   . Hypercholesterolemia   . Reflux    History reviewed. No pertinent past surgical history. Social History:  reports that he has never smoked. He does not have any smokeless tobacco history on file. He reports that he does not  drink alcohol or use illicit drugs. Widowed. Independent of activities of daily living.  Allergies  Allergen Reactions  . Penicillins Rash and Other (See Comments)    Pt states dr told him if he took penicillin again "it could be the last time"    History reviewed. No pertinent family history. denies family history.  Prior to Admission medications   Medication Sig Start Date End Date Taking? Authorizing Provider  calcium carbonate (TUMS) 500 MG chewable tablet Chew 1 tablet by mouth daily.   Yes Historical Provider, MD  CALCIUM PO Take 1 tablet by mouth daily.   Yes Historical Provider, MD  Fe Fum-FA-B Cmp-C-Zn-Mg-Mn-Cu (HEMATINIC PLUS VIT/MINERALS) 106-1 MG TABS Take 1 tablet by mouth daily.   Yes Historical Provider, MD  metoprolol succinate (TOPROL-XL) 25 MG 24 hr tablet Take 25 mg by mouth daily.   Yes Historical Provider, MD  simvastatin (ZOCOR) 40 MG tablet Take 1 tablet (40 mg total) by mouth every evening. 08/28/13  Yes Freeman Caldron, PA-C  Vitamin D, Ergocalciferol, (DRISDOL) 50000 UNITS CAPS capsule Take 50,000 Units by mouth every 30 (thirty) days.    Yes Historical Provider, MD   Physical Exam: Filed Vitals:   11/26/13 1400 11/26/13 1711 11/26/13 1727 11/26/13 1731  BP: 135/91 131/72  131/72  Pulse: 104 69    Temp:  96.8 F (36 C)  96.8 F (36 C)  TempSrc:    Oral  Resp:      Height:  5\' 5"  (1.651 m)  5\' 5"  (1.651 m)  Weight:  63.05 kg (139 lb) 63.05 kg (139 lb) 63.5 kg (139 lb 15.9 oz)  SpO2: 97% 94%  94%  General exam: Moderately built and nourished elderly pleasant patient, lying comfortably propped up in bed in no obvious distress.  Head, eyes and ENT: Nontraumatic and normocephalic. Pupils equally reacting to light and accommodation. Oral mucosa moist.  Neck: Supple. No JVD, carotid bruit or thyromegaly.  Lymphatics: No lymphadenopathy.  Respiratory system: Reduced breath sounds in the bases with bibasal few fine crackles. Rest of lung fields are  clear to auscultation. No increased work of breathing.  Cardiovascular system: S1 and S2 heard, RRR. JVD +. Bilateral leg edema 2+ extending to the lower thighs. No gallops, murmurs or clicks.  Gastrointestinal system: Abdomen is nondistended, soft and nontender. Normal bowel sounds heard. No organomegaly or masses appreciated.  Central nervous system: Alert and oriented. No focal neurological deficits.  Extremities: Symmetric 5 x 5 power. Peripheral pulses symmetrically felt.   Skin: No rashes or acute findings.  Musculoskeletal system: Negative exam.  Psychiatry: Pleasant and cooperative.   Labs on Admission:  Basic Metabolic Panel:  Recent Labs Lab 11/26/13 1320  NA 148*  K 4.8  CL 105  CO2 29  GLUCOSE 122*  BUN 28*  CREATININE 1.64*  CALCIUM 9.7   Liver Function Tests:  Recent Labs Lab 11/26/13 1320  AST 34  ALT 23  ALKPHOS 113  BILITOT 0.7  PROT 7.8  ALBUMIN 3.8   No results found for this basename: LIPASE, AMYLASE,  in the last 168 hours No results found for this basename: AMMONIA,  in the last 168 hours CBC:  Recent Labs Lab 11/26/13 1320  WBC 7.2  NEUTROABS 5.1  HGB 14.3  HCT 45.1  MCV 101.3*  PLT 126*   Cardiac Enzymes:  Recent Labs Lab 11/26/13 1320  TROPONINI <0.30    BNP (last 3 results)  Recent Labs  11/26/13 1320  PROBNP 17280.0*   CBG: No results found for this basename: GLUCAP,  in the last 168 hours  Radiological Exams on Admission: Dg Chest 2 View  11/26/2013   CLINICAL DATA:  Shortness of breath. Lower extremity edema. Prior history of CHF.  EXAM: CHEST  2 VIEW  COMPARISON:  DG CHEST 1V PORT dated 08/26/2013; DG CHEST 1V PORT dated 08/25/2013; DG CHEST 1V PORT dated 08/24/2013; DG CHEST 2 VIEW dated 04/08/2012  FINDINGS: Cardiac silhouette moderately to markedly enlarged but stable. Mild diffuse interstitial pulmonary edema, less severe than on the November, 2014 examinations. Bilateral pleural effusions. No confluent  airspace consolidation. Degenerative changes throughout the thoracic spine and slight exaggeration of the usual thoracic kyphosis, unchanged.  IMPRESSION: Mild CHF, with stable moderate to marked cardiomegaly and mild interstitial pulmonary edema. Bilateral pleural effusions.   Electronically Signed   By: Hulan Saas M.D.   On: 11/26/2013 14:05    EKG: Independently reviewed. Baseline artifact but seems to be in sinus tachycardia at 103 beats per minute,? LAD, T wave inversions in inferior lateral leads. No acute changes.  Assessment/Plan Principal Problem:   Acute CHF (congestive heart failure) Active Problems:   Hypertension   Hypercholesterolemia   Reflux   CKD (chronic kidney disease), stage III   Hypernatremia   Thrombocytopenia, unspecified   Pedal edema   Acute CHF   1. Acute CHF: Unknown type-no echo in system. As evidenced by pedal edema, DOE, pulmonary edema and cardiomegaly on chest x-ray and markedly elevated proBNP. Admitted to telemetry. Patient received a dose of Lasix this evening-we'll monitor response and urine output. Continue Lasix 40 mg IV daily beginning tomorrow. Check 2-D echo and TSH. 2. Hypernatremia:  Unclear etiology. Patient clinically appears hypervolemic. Follow BMP in a.m. 3. Stage III chronic kidney disease: Creatinine seems to be at baseline. 4. Hypertension: Controlled. Continue metoprolol. 5. Hypercholesterolemia: Continue statins. 6. Thrombocytopenia: Unclear etiology. Follow CBC in a.m.     Code Status: Full  Family Communication: None at bedside  Disposition Plan: Home when medically stable   Time spent: 60 minutes  Loretta Kluender, MD, FACP, FHM. [facility] Pager [phone]  If 7PM-7AM, please contact night-coverage [email or web address] Password [email or web address] 11/26/2013, 6:29 PM

## 2013-11-26 NOTE — ED Provider Notes (Signed)
CSN: [id number]     Arrival date & time 11/26/13  1237 History  This chart was scribed for Charles B. Bernette MayersSheldon, MD by Leone PayorSonum Patel, ED Scribe. This patient was seen in room APA07/APA07 and the patient's care was started 1:07 PM.    Chief Complaint  Patient presents with  . Fatigue      The history is provided by the patient. No language interpreter was used.    HPI Comments: Calvin Rivas is a 80 y.o. male who presents to the Emergency Department complaining of 1-1.5 weeks of gradual onset, gradually worsening, constant bilateral lower leg swelling. He also reports intermittent decreased appetite for the past week. He has associated fatigue and intermittent SOB. He denies nausea, vomiting, constipation, diarrhea. He denies tobacco or alcohol use. He was previously on diuretics and ARB but stopped due to increasing creatinine found during recent admission for fall/head injury. Saw his PCP recently and started on metoprolol.     Past Medical History  Diagnosis Date  . Hypertension   . Hypercholesterolemia   . Reflux    History reviewed. No pertinent past surgical history. No family history on file. History  Substance Use Topics  . Smoking status: Never Smoker   . Smokeless tobacco: Not on file  . Alcohol Use: No    Review of Systems  A complete 10 system review of systems was obtained and all systems are negative except as noted in the HPI and PMH.    Allergies  Penicillins  Home Medications   Current Outpatient Rx  Name  Route  Sig  Dispense  Refill  . calcium carbonate (TUMS) 500 MG chewable tablet   Oral   Chew 1 tablet by mouth daily.         Marland Kitchen. CALCIUM PO   Oral   Take 1 tablet by mouth daily.         Marland Kitchen. doxycycline (VIBRAMYCIN) 100 MG capsule   Oral   Take 1 capsule (100 mg total) by mouth 2 (two) times daily. 10 day course starting on 08/22/2013   6 capsule   0   . Fe Fum-FA-B Cmp-C-Zn-Mg-Mn-Cu (HEMATINIC PLUS VIT/MINERALS) 106-1 MG TABS   Oral   Take 1  tablet by mouth daily.         . simvastatin (ZOCOR) 40 MG tablet   Oral   Take 1 tablet (40 mg total) by mouth every evening.   30 tablet   0   . traMADol (ULTRAM) 50 MG tablet   Oral   Take 1-2 tablets (50-100 mg total) by mouth every 6 (six) hours as needed (Pain).   40 tablet   0   . Vitamin D, Ergocalciferol, (DRISDOL) 50000 UNITS CAPS capsule   Oral   Take 50,000 Units by mouth every 7 (seven) days.          BP 123/91  Pulse 104  Temp(Src) 97.3 F (36.3 C)  Resp 18  Ht 5\' 7"  (1.702 m)  Wt 136 lb (61.689 kg)  BMI 21.30 kg/m2  SpO2 95% Physical Exam  Nursing note and vitals reviewed. Constitutional: He is oriented to person, place, and time. He appears well-developed and well-nourished.  HENT:  Head: Normocephalic and atraumatic.  Mouth/Throat: Mucous membranes are dry.  Eyes: EOM are normal. Pupils are equal, round, and reactive to light.  Neck: Normal range of motion. Neck supple.  Cardiovascular: Normal rate, normal heart sounds and intact distal pulses.   Pulmonary/Chest: Effort normal and breath sounds normal.  Abdominal: Bowel sounds are normal. He exhibits no distension. There is no tenderness.  Musculoskeletal: Normal range of motion. He exhibits edema (pitting edema to the bilateral lower extremities to the mid thigh ). He exhibits no tenderness.  Neurological: He is alert and oriented to person, place, and time. He has normal strength. No cranial nerve deficit or sensory deficit.  Skin: Skin is warm and dry. No rash noted.  Psychiatric: He has a normal mood and affect.    ED Course  Procedures (including critical care time)  DIAGNOSTIC STUDIES: Oxygen Saturation is 95% on RA, adequate by my interpretation.    COORDINATION OF CARE: 1:10 PM Discussed treatment plan with pt at bedside and pt agreed to plan.   Labs Review Labs Reviewed  CBC WITH DIFFERENTIAL - Abnormal; Notable for the following:    MCV 101.3 (*)    RDW 15.8 (*)    Platelets  126 (*)    All other components within normal limits  PROTIME-INR  APTT  URINALYSIS, ROUTINE W REFLEX MICROSCOPIC  TROPONIN I  COMPREHENSIVE METABOLIC PANEL   Imaging Review No results found.  EKG Interpretation    Date/Time:  Thursday November 26 2013 13:29:18 EST Ventricular Rate:  103 PR Interval:    QRS Duration: 90 QT Interval:  382 QTC Calculation: 500 R Axis:   110 Text Interpretation:  Accelerated Junctional rhythm  vs sinus with small p-waves Right axis deviation Right ventricular hypertrophy T wave abnormality, consider lateral ischemia Abnormal ECG When compared with ECG of 24-Aug-2013 11:34, Junctional rhythm has replaced Sinus rhythm Inverted T waves have replaced nonspecific T wave abnormality in Lateral leads Confirmed by SHELDON  MD, CHARLES ([id number]) on 11/26/2013 1:44:34 PM            MDM   Final diagnoses:  CHF (congestive heart failure)  Dehydration    Pt with peripheral edema, dry mouth, anorexia and general weakness. Will check labs, EKG, CXR and reassess.   4:11 PM Pt with pulm edema on xray and increased BNP in setting of borderline high Na and CKD. Admit for further eval.   I personally performed the services described in this documentation, which was scribed in my presence. The recorded information has been reviewed and is accurate.      Charles B. Bernette Mayers, MD 11/26/13 [phone]

## 2013-11-26 NOTE — ED Notes (Signed)
Pt c/o bilateral ankle swelling, sore throat, loss of appetite and fatigue x 1 week.

## 2013-11-26 NOTE — ED Notes (Signed)
Patient transported to X-ray 

## 2013-11-27 DIAGNOSIS — I059 Rheumatic mitral valve disease, unspecified: Secondary | ICD-10-CM

## 2013-11-27 LAB — CBC
HEMATOCRIT: 38.7 % — AB (ref 39.0–52.0)
HEMOGLOBIN: 12.6 g/dL — AB (ref 13.0–17.0)
MCH: 32.4 pg (ref 26.0–34.0)
MCHC: 32.6 g/dL (ref 30.0–36.0)
MCV: 99.5 fL (ref 78.0–100.0)
Platelets: 112 10*3/uL — ABNORMAL LOW (ref 150–400)
RBC: 3.89 MIL/uL — AB (ref 4.22–5.81)
RDW: 15.7 % — ABNORMAL HIGH (ref 11.5–15.5)
WBC: 5.2 10*3/uL (ref 4.0–10.5)

## 2013-11-27 LAB — BASIC METABOLIC PANEL
BUN: 26 mg/dL — AB (ref 6–23)
CHLORIDE: 103 meq/L (ref 96–112)
CO2: 30 mEq/L (ref 19–32)
Calcium: 9.1 mg/dL (ref 8.4–10.5)
Creatinine, Ser: 1.63 mg/dL — ABNORMAL HIGH (ref 0.50–1.35)
GFR calc non Af Amer: 35 mL/min — ABNORMAL LOW (ref >90)
GFR, EST AFRICAN AMERICAN: 41 mL/min — AB (ref >90)
Glucose, Bld: 103 mg/dL — ABNORMAL HIGH (ref 70–99)
POTASSIUM: 4 meq/L (ref 3.7–5.3)
SODIUM: 146 meq/L (ref 137–147)

## 2013-11-27 LAB — TSH: TSH: 2.535 u[IU]/mL (ref 0.350–4.500)

## 2013-11-27 NOTE — Progress Notes (Signed)
*  PRELIMINARY RESULTS* Echocardiogram 2D Echocardiogram has been performed.  Calvin Rivas 11/27/2013, 9:14 AM

## 2013-11-27 NOTE — Progress Notes (Signed)
UR completed. Patient changed to inpatient- requiring IV lasix daily

## 2013-11-27 NOTE — Progress Notes (Signed)
PROGRESS NOTE    Calvin Rivas [id number] DOB: [birth year] DOA: 11/26/2013 PCP: Glori BickersRIVEDI,RAJENDRA, MD  HPI/Brief narrative 80 y.o. male with history of hypertension, hypercholesterolemia, reflux, denies cardiac history, prior mechanical fall associated with multiple left-sided rib fractures, traumatic subarachnoid hemorrhage, acute blood loss anemia and acute kidney injury, presented to the ED on 11/26/13 with complaints of bilateral leg swelling and dyspnea on exertion. She was assessed to have decompensated CHF, mild hypernatremia of unknown etiology and stage III chronic kidney disease.  Assessment/Plan:  1. Acute CHF: Unknown type-no echo in system. As evidenced by pedal edema, DOE, pulmonary edema and cardiomegaly on chest x-ray and markedly elevated proBNP. Admitted to telemetry. He was started on IV Lasix 40 mg daily. Has diuresed overnight and clinically better. Check 2-D echo and TSH. 2. Hypernatremia: Unclear etiology. Patient clinically appears hypervolemic. Slightly better. Follow BMP in a.m. 3. Stage III chronic kidney disease: Creatinine seems to be at baseline. 4. Hypertension: Controlled. Continue metoprolol. 5. Hypercholesterolemia: Continue statins. 6. Thrombocytopenia: Unclear etiology. Stable.    Code Status: Full Family Communication: None at bedside Disposition Plan: Home when medically stable   Consultants:  None  Procedures:  None  Antibiotics:  None   Subjective: Feels much better-improvement in leg swelling and dyspnea. Denies chest pain.  Objective: Filed Vitals:   11/26/13 1731 11/26/13 2059 11/27/13 0556 11/27/13 1449  BP: 131/72 110/51 127/83 104/67  Pulse:  104 100 101  Temp: 96.8 F (36 C) 97.4 F (36.3 C) 97.2 F (36.2 C) 97.4 F (36.3 C)  TempSrc: Oral Oral Oral Oral  Resp:    20  Height: 5\' 5"  (1.651 m)     Weight: 63.5 kg (139 lb 15.9 oz)  62.959 kg (138 lb 12.8 oz)   SpO2: 94% 94% 94% 93%    Intake/Output Summary (Last  24 hours) at 11/27/13 1557 Last data filed at 11/27/13 1300  Gross per 24 hour  Intake    720 ml  Output   1850 ml  Net  -1130 ml   Filed Weights   11/26/13 1727 11/26/13 1731 11/27/13 0556  Weight: 63.05 kg (139 lb) 63.5 kg (139 lb 15.9 oz) 62.959 kg (138 lb 12.8 oz)     Exam:  General exam: Pleasant elderly male sitting up comfortably in bed. Respiratory system: Slightly reduced breath sounds in the bases with few basal crackles. No increased work of breathing. Cardiovascular system: S1 & S2 heard, RRR. No JVD, murmurs, gallops, clicks. 1+ pitting edema-reduced compared to admission. Gastrointestinal system: Abdomen is nondistended, soft and nontender. Normal bowel sounds heard. Central nervous system: Alert and oriented. No focal neurological deficits. Extremities: Symmetric 5 x 5 power.   Data Reviewed: Basic Metabolic Panel:  Recent Labs Lab 11/26/13 1320 11/27/13 0625  NA 148* 146  K 4.8 4.0  CL 105 103  CO2 29 30  GLUCOSE 122* 103*  BUN 28* 26*  CREATININE 1.64* 1.63*  CALCIUM 9.7 9.1   Liver Function Tests:  Recent Labs Lab 11/26/13 1320  AST 34  ALT 23  ALKPHOS 113  BILITOT 0.7  PROT 7.8  ALBUMIN 3.8   No results found for this basename: LIPASE, AMYLASE,  in the last 168 hours No results found for this basename: AMMONIA,  in the last 168 hours CBC:  Recent Labs Lab 11/26/13 1320 11/27/13 0625  WBC 7.2 5.2  NEUTROABS 5.1  --   HGB 14.3 12.6*  HCT 45.1 38.7*  MCV 101.3* 99.5  PLT 126* 112*  Cardiac Enzymes:  Recent Labs Lab 11/26/13 1320  TROPONINI <0.30   BNP (last 3 results)  Recent Labs  11/26/13 1320  PROBNP 17280.0*   CBG: No results found for this basename: GLUCAP,  in the last 168 hours  No results found for this or any previous visit (from the past 240 hour(s)).    Additional labs: 1. TSH: 2.535     Studies: Dg Chest 2 View  11/26/2013   CLINICAL DATA:  Shortness of breath. Lower extremity edema. Prior  history of CHF.  EXAM: CHEST  2 VIEW  COMPARISON:  DG CHEST 1V PORT dated 08/26/2013; DG CHEST 1V PORT dated 08/25/2013; DG CHEST 1V PORT dated 08/24/2013; DG CHEST 2 VIEW dated 04/08/2012  FINDINGS: Cardiac silhouette moderately to markedly enlarged but stable. Mild diffuse interstitial pulmonary edema, less severe than on the November, 2014 examinations. Bilateral pleural effusions. No confluent airspace consolidation. Degenerative changes throughout the thoracic spine and slight exaggeration of the usual thoracic kyphosis, unchanged.  IMPRESSION: Mild CHF, with stable moderate to marked cardiomegaly and mild interstitial pulmonary edema. Bilateral pleural effusions.   Electronically Signed   By: Hulan Saas M.D.   On: 11/26/2013 14:05        Scheduled Meds: . calcium carbonate  1 tablet Oral Daily  . furosemide  40 mg Intravenous Daily  . metoprolol succinate  25 mg Oral Daily  . simvastatin  40 mg Oral QPM  . sodium chloride  3 mL Intravenous Q12H   Continuous Infusions:   Principal Problem:   Acute CHF (congestive heart failure) Active Problems:   Hypertension   Hypercholesterolemia   Reflux   CKD (chronic kidney disease), stage III   Hypernatremia   Thrombocytopenia, unspecified   Pedal edema   Acute CHF    Time spent: 40 minutes    Jamarco Zaldivar, MD, FACP, FHM. [facility] Pager [phone]  If 7PM-7AM, please contact night-coverage [email or web address] Password [facility] 11/27/2013, 3:57 PM    LOS: 1 day

## 2013-11-28 DIAGNOSIS — I428 Other cardiomyopathies: Secondary | ICD-10-CM

## 2013-11-28 DIAGNOSIS — I5041 Acute combined systolic (congestive) and diastolic (congestive) heart failure: Principal | ICD-10-CM

## 2013-11-28 DIAGNOSIS — I429 Cardiomyopathy, unspecified: Secondary | ICD-10-CM | POA: Diagnosis present

## 2013-11-28 DIAGNOSIS — D696 Thrombocytopenia, unspecified: Secondary | ICD-10-CM

## 2013-11-28 LAB — BASIC METABOLIC PANEL
BUN: 25 mg/dL — ABNORMAL HIGH (ref 6–23)
CO2: 31 meq/L (ref 19–32)
CREATININE: 1.64 mg/dL — AB (ref 0.50–1.35)
Calcium: 8.9 mg/dL (ref 8.4–10.5)
Chloride: 101 mEq/L (ref 96–112)
GFR calc Af Amer: 41 mL/min — ABNORMAL LOW (ref >90)
GFR calc non Af Amer: 35 mL/min — ABNORMAL LOW (ref >90)
GLUCOSE: 99 mg/dL (ref 70–99)
Potassium: 3.7 mEq/L (ref 3.7–5.3)
Sodium: 144 mEq/L (ref 137–147)

## 2013-11-28 LAB — CBC
HEMATOCRIT: 38.6 % — AB (ref 39.0–52.0)
HEMOGLOBIN: 12.4 g/dL — AB (ref 13.0–17.0)
MCH: 32 pg (ref 26.0–34.0)
MCHC: 32.1 g/dL (ref 30.0–36.0)
MCV: 99.7 fL (ref 78.0–100.0)
Platelets: 112 10*3/uL — ABNORMAL LOW (ref 150–400)
RBC: 3.87 MIL/uL — ABNORMAL LOW (ref 4.22–5.81)
RDW: 15.8 % — ABNORMAL HIGH (ref 11.5–15.5)
WBC: 5.2 10*3/uL (ref 4.0–10.5)

## 2013-11-28 MED ORDER — LISINOPRIL 5 MG PO TABS
2.5000 mg | ORAL_TABLET | Freq: Every day | ORAL | Status: DC
  Administered 2013-11-28 – 2013-11-30 (×3): 2.5 mg via ORAL
  Filled 2013-11-28 (×3): qty 1

## 2013-11-28 NOTE — Progress Notes (Signed)
PROGRESS NOTE    Calvin Rivas ZOX:[id number] DOB: 1921-08-08 DOA: 11/26/2013 PCP: Glori Bickers, MD  HPI/Brief narrative 80 y.o. male with history of hypertension, hypercholesterolemia, reflux, denies cardiac history, prior mechanical fall associated with multiple left-sided rib fractures, traumatic subarachnoid hemorrhage, acute blood loss anemia and acute kidney injury, presented to the ED on 11/26/13 with complaints of bilateral leg swelling and dyspnea on exertion. She was assessed to have decompensated CHF, mild hypernatremia of unknown etiology and stage III chronic kidney disease.  Assessment/Plan:  1. Acute systolic and diastolic CHF: Echo 11/27/13 shows moderate LVH, LVEF 20-25% and several wall motion abnormalities. On admission, he had pedal edema, DOE, pulmonary edema and cardiomegaly on chest x-ray and markedly elevated proBNP. Admitted to telemetry. He was started on IV Lasix 40 mg daily. Clinically continues to improve. Continue additional day of IV Lasix and then switched to oral. Cardiology consultation on Monday for possible ischemic evaluation. 2. Cardiomyopathy: Ischemic versus nonischemic. Start low dose lisinopril and continue metoprolol. 3. Hypernatremia: Unclear etiology. Patient clinically appears hypervolemic. Continues to improve. 4. Stage III chronic kidney disease: Creatinine seems to be at baseline. Monitor while on diuretics and lisinopril. 5. Hypertension: Controlled. Continue metoprolol. 6. Hypercholesterolemia: Continue statins. 7. Thrombocytopenia: Unclear etiology. Stable.    Code Status: Full Family Communication: None at bedside Disposition Plan: Home when medically stable   Consultants:  None  Procedures:  None  Antibiotics:  None   Subjective: Continues to feel better with improving dyspnea and leg edema. No chest pain.  Objective: Filed Vitals:   11/27/13 2105 11/27/13 2232 11/28/13 0522 11/28/13 1327  BP: 110/76  131/91 91/55    Pulse: 102 101 97 103  Temp: 97.9 F (36.6 C)  97.9 F (36.6 C) 98.1 F (36.7 C)  TempSrc: Oral  Oral   Resp: 20 16 20 20   Height:      Weight:   61.825 kg (136 lb 4.8 oz)   SpO2: 93% 93% 96% 94%    Intake/Output Summary (Last 24 hours) at 11/28/13 1449 Last data filed at 11/28/13 1225  Gross per 24 hour  Intake    720 ml  Output    900 ml  Net   -180 ml   Filed Weights   11/26/13 1731 11/27/13 0556 11/28/13 0522  Weight: 63.5 kg (139 lb 15.9 oz) 62.959 kg (138 lb 12.8 oz) 61.825 kg (136 lb 4.8 oz)     Exam:  General exam: Pleasant elderly male sitting up comfortably on the chair. Respiratory system: Occasional basal crackles but rest of lung fields are clear to auscultation. No increased work of breathing. Cardiovascular system: S1 & S2 heard, RRR. No JVD, murmurs, gallops, clicks. 1+ pitting edema-continues to decrease. Gastrointestinal system: Abdomen is nondistended, soft and nontender. Normal bowel sounds heard. Central nervous system: Alert and oriented. No focal neurological deficits. Extremities: Symmetric 5 x 5 power.   Data Reviewed: Basic Metabolic Panel:  Recent Labs Lab 11/26/13 1320 11/27/13 0625 11/28/13 0611  NA 148* 146 144  K 4.8 4.0 3.7  CL 105 103 101  CO2 29 30 31   GLUCOSE 122* 103* 99  BUN 28* 26* 25*  CREATININE 1.64* 1.63* 1.64*  CALCIUM 9.7 9.1 8.9   Liver Function Tests:  Recent Labs Lab 11/26/13 1320  AST 34  ALT 23  ALKPHOS 113  BILITOT 0.7  PROT 7.8  ALBUMIN 3.8   No results found for this basename: LIPASE, AMYLASE,  in the last 168 hours No results found for this  basename: AMMONIA,  in the last 168 hours CBC:  Recent Labs Lab 11/26/13 1320 11/27/13 0625 11/28/13 0611  WBC 7.2 5.2 5.2  NEUTROABS 5.1  --   --   HGB 14.3 12.6* 12.4*  HCT 45.1 38.7* 38.6*  MCV 101.3* 99.5 99.7  PLT 126* 112* 112*   Cardiac Enzymes:  Recent Labs Lab 11/26/13 1320  TROPONINI <0.30   BNP (last 3 results)  Recent Labs   11/26/13 1320  PROBNP 17280.0*   CBG: No results found for this basename: GLUCAP,  in the last 168 hours  No results found for this or any previous visit (from the past 240 hour(s)).    Additional labs: 1. TSH: 2.535 2. 2-D echo 11/27/13: Study Conclusions  - Study data: Technically adequate study. - Left ventricle: The cavity size was normal. Wall thickness was increased in a pattern of moderate LVH. Systolic function was severely reduced. The estimated ejection fraction was in the range of 20% to 25%. Findings consistent with left ventricular diastolic dysfunction, grade indeterminant. There is evidence of elevated LA pressure (E/e' 18). - Regional wall motion abnormality: Hypokinesis of the basal-mid anteroseptal, mid inferoseptal, and apical septal myocardium. - Aortic valve: Moderately calcified annulus. Trileaflet; mildly thickened leaflets. Valve area: 1.85cm^2(VTI). Valve area: 1.76cm^2 (Vmax). - Mitral valve: Mildly calcified annulus. Mildly thickened leaflets . Mild regurgitation. - Left atrium: The atrium was moderately dilated. - Right ventricle: The cavity size was moderately to severely dilated. Systolic function was severely reduced. RV TAPSE is 0.6 cm. - Right atrium: The atrium was moderately to severely dilated. - Pulmonary arteries: Systolic pressure was moderately increased in the setting of severe RV dysfunction. PA peak pressure: 60mm Hg (S). - Inferior vena cava: The vessel was dilated; the respirophasic diameter changes were blunted (< 50%); findings are consistent with elevated central venous pressure.       Studies: No results found.      Scheduled Meds: . calcium carbonate  1 tablet Oral Daily  . furosemide  40 mg Intravenous Daily  . lisinopril  2.5 mg Oral Daily  . metoprolol succinate  25 mg Oral Daily  . simvastatin  40 mg Oral QPM  . sodium chloride  3 mL Intravenous Q12H   Continuous Infusions:   Principal Problem:    Acute CHF (congestive heart failure) Active Problems:   Hypertension   Hypercholesterolemia   Reflux   CKD (chronic kidney disease), stage III   Hypernatremia   Thrombocytopenia, unspecified   Pedal edema   Acute CHF    Time spent: 30 minutes    Jinnifer Montejano, MD, FACP, FHM. [facility] Pager [phone]  If 7PM-7AM, please contact night-coverage [email or web address] Password [email or web address] 11/28/2013, 2:49 PM    LOS: 2 days

## 2013-11-29 LAB — BASIC METABOLIC PANEL
BUN: 31 mg/dL — ABNORMAL HIGH (ref 6–23)
CALCIUM: 8.6 mg/dL (ref 8.4–10.5)
CHLORIDE: 98 meq/L (ref 96–112)
CO2: 33 mEq/L — ABNORMAL HIGH (ref 19–32)
CREATININE: 1.71 mg/dL — AB (ref 0.50–1.35)
GFR calc Af Amer: 39 mL/min — ABNORMAL LOW (ref >90)
GFR calc non Af Amer: 33 mL/min — ABNORMAL LOW (ref >90)
GLUCOSE: 93 mg/dL (ref 70–99)
Potassium: 3.8 mEq/L (ref 3.7–5.3)
Sodium: 142 mEq/L (ref 137–147)

## 2013-11-29 MED ORDER — FUROSEMIDE 40 MG PO TABS
40.0000 mg | ORAL_TABLET | Freq: Every day | ORAL | Status: DC
  Administered 2013-11-29 – 2013-11-30 (×2): 40 mg via ORAL
  Filled 2013-11-29 (×2): qty 1

## 2013-11-29 NOTE — Progress Notes (Signed)
PROGRESS NOTE    Calvin Rivas HYH:[id number] DOB: 11/09/1920 DOA: 11/26/2013 PCP: Glori Bickers, MD  HPI/Brief narrative 80 y.o. male with history of hypertension, hypercholesterolemia, reflux, denies cardiac history, prior mechanical fall associated with multiple left-sided rib fractures, traumatic subarachnoid hemorrhage, acute blood loss anemia and acute kidney injury, presented to the ED on 11/26/13 with complaints of bilateral leg swelling and dyspnea on exertion. She was assessed to have decompensated CHF, mild hypernatremia of unknown etiology and stage III chronic kidney disease.  Assessment/Plan:  1. Acute systolic and diastolic CHF: Echo 11/27/13 shows moderate LVH, LVEF 20-25% and several wall motion abnormalities. On admission, he had pedal edema, DOE, pulmonary edema and cardiomegaly on chest x-ray and markedly elevated proBNP. Admitted to telemetry. He was started on IV Lasix 40 mg daily. Clinically continues to improve. Transitioned to oral Lasix on 11/29/13. Cardiology consultation on Monday for possible ischemic evaluation- will make him n.p.o. after midnight tonight. 2. Cardiomyopathy: Ischemic versus nonischemic. Started low dose lisinopril and continue metoprolol. 3. Hypernatremia: Unclear etiology. Patient clinically appears hypervolemic. Resolved 4. Stage III chronic kidney disease: Creatinine has increased slightly from 1.6 > 1.7. Follow BMP in a.m. Lasix changed to by mouth. 5. Hypertension: Controlled. Continue metoprolol. 6. Hypercholesterolemia: Continue statins. 7. Thrombocytopenia: Unclear etiology. Stable.    Code Status: Full Family Communication: None at bedside Disposition Plan: Home when medically stable   Consultants:  None  Procedures:  None  Antibiotics:  None   Subjective: Denies dyspnea. Leg edema significantly improved. No chest pain.  Objective: Filed Vitals:   11/28/13 1327 11/29/13 0433 11/29/13 0823 11/29/13 1327  BP: 91/55  104/70 117/67 100/63  Pulse: 103 100 101 100  Temp: 98.1 F (36.7 C) 98.6 F (37 C)  98.4 F (36.9 C)  TempSrc:  Oral  Oral  Resp: 20 20  20   Height:      Weight:      SpO2: 94% 91%  97%    Intake/Output Summary (Last 24 hours) at 11/29/13 1653 Last data filed at 11/29/13 1345  Gross per 24 hour  Intake    720 ml  Output    800 ml  Net    -80 ml   Filed Weights   11/26/13 1731 11/27/13 0556 11/28/13 0522  Weight: 63.5 kg (139 lb 15.9 oz) 62.959 kg (138 lb 12.8 oz) 61.825 kg (136 lb 4.8 oz)     Exam:  General exam: Pleasant elderly male sitting up comfortably on the chair. Respiratory system: Occasional basal crackles but rest of lung fields are clear to auscultation. No increased work of breathing. Cardiovascular system: S1 & S2 heard, RRR. No JVD, murmurs, gallops, clicks. 1+ pitting edema-continues to decrease. Telemetry:? Junctional tachycardia in the 100s Gastrointestinal system: Abdomen is nondistended, soft and nontender. Normal bowel sounds heard. Central nervous system: Alert and oriented. No focal neurological deficits. Extremities: Symmetric 5 x 5 power.   Data Reviewed: Basic Metabolic Panel:  Recent Labs Lab 11/26/13 1320 11/27/13 0625 11/28/13 0611 11/29/13 0609  NA 148* 146 144 142  K 4.8 4.0 3.7 3.8  CL 105 103 101 98  CO2 29 30 31  33*  GLUCOSE 122* 103* 99 93  BUN 28* 26* 25* 31*  CREATININE 1.64* 1.63* 1.64* 1.71*  CALCIUM 9.7 9.1 8.9 8.6   Liver Function Tests:  Recent Labs Lab 11/26/13 1320  AST 34  ALT 23  ALKPHOS 113  BILITOT 0.7  PROT 7.8  ALBUMIN 3.8   No results found for this basename: LIPASE,  AMYLASE,  in the last 168 hours No results found for this basename: AMMONIA,  in the last 168 hours CBC:  Recent Labs Lab 11/26/13 1320 11/27/13 0625 11/28/13 0611  WBC 7.2 5.2 5.2  NEUTROABS 5.1  --   --   HGB 14.3 12.6* 12.4*  HCT 45.1 38.7* 38.6*  MCV 101.3* 99.5 99.7  PLT 126* 112* 112*   Cardiac Enzymes:  Recent  Labs Lab 11/26/13 1320  TROPONINI <0.30   BNP (last 3 results)  Recent Labs  11/26/13 1320  PROBNP 17280.0*   CBG: No results found for this basename: GLUCAP,  in the last 168 hours  No results found for this or any previous visit (from the past 240 hour(s)).    Additional labs: 1. TSH: 2.535 2. 2-D echo 11/27/13: Study Conclusions  - Study data: Technically adequate study. - Left ventricle: The cavity size was normal. Wall thickness was increased in a pattern of moderate LVH. Systolic function was severely reduced. The estimated ejection fraction was in the range of 20% to 25%. Findings consistent with left ventricular diastolic dysfunction, grade indeterminant. There is evidence of elevated LA pressure (E/e' 18). - Regional wall motion abnormality: Hypokinesis of the basal-mid anteroseptal, mid inferoseptal, and apical septal myocardium. - Aortic valve: Moderately calcified annulus. Trileaflet; mildly thickened leaflets. Valve area: 1.85cm^2(VTI). Valve area: 1.76cm^2 (Vmax). - Mitral valve: Mildly calcified annulus. Mildly thickened leaflets . Mild regurgitation. - Left atrium: The atrium was moderately dilated. - Right ventricle: The cavity size was moderately to severely dilated. Systolic function was severely reduced. RV TAPSE is 0.6 cm. - Right atrium: The atrium was moderately to severely dilated. - Pulmonary arteries: Systolic pressure was moderately increased in the setting of severe RV dysfunction. PA peak pressure: 60mm Hg (S). - Inferior vena cava: The vessel was dilated; the respirophasic diameter changes were blunted (< 50%); findings are consistent with elevated central venous pressure.       Studies: No results found.      Scheduled Meds: . calcium carbonate  1 tablet Oral Daily  . furosemide  40 mg Oral Daily  . lisinopril  2.5 mg Oral Daily  . metoprolol succinate  25 mg Oral Daily  . simvastatin  40 mg Oral QPM  . sodium  chloride  3 mL Intravenous Q12H   Continuous Infusions:   Principal Problem:   Systolic and diastolic CHF, acute Active Problems:   Hypertension   Hypercholesterolemia   Reflux   Acute CHF (congestive heart failure)   CKD (chronic kidney disease), stage III   Hypernatremia   Thrombocytopenia, unspecified   Pedal edema   Cardiomyopathy    Time spent: 30 minutes    Jamilya Sarrazin, MD, FACP, FHM. [facility] Pager [phone]  If 7PM-7AM, please contact night-coverage [email or web address] Password [email or web address] 11/29/2013, 4:53 PM    LOS: 3 days

## 2013-11-30 DIAGNOSIS — E7800 Pure hypercholesterolemia, unspecified: Secondary | ICD-10-CM

## 2013-11-30 DIAGNOSIS — I519 Heart disease, unspecified: Secondary | ICD-10-CM

## 2013-11-30 DIAGNOSIS — N179 Acute kidney failure, unspecified: Secondary | ICD-10-CM

## 2013-11-30 DIAGNOSIS — I2589 Other forms of chronic ischemic heart disease: Secondary | ICD-10-CM

## 2013-11-30 LAB — BASIC METABOLIC PANEL
BUN: 31 mg/dL — AB (ref 6–23)
CALCIUM: 8.7 mg/dL (ref 8.4–10.5)
CO2: 32 mEq/L (ref 19–32)
Chloride: 101 mEq/L (ref 96–112)
Creatinine, Ser: 1.6 mg/dL — ABNORMAL HIGH (ref 0.50–1.35)
GFR calc Af Amer: 42 mL/min — ABNORMAL LOW (ref >90)
GFR, EST NON AFRICAN AMERICAN: 36 mL/min — AB (ref >90)
Glucose, Bld: 93 mg/dL (ref 70–99)
Potassium: 4 mEq/L (ref 3.7–5.3)
Sodium: 144 mEq/L (ref 137–147)

## 2013-11-30 MED ORDER — METOPROLOL SUCCINATE ER 25 MG PO TB24: 50.0000 mg | ORAL_TABLET | Freq: Every day | ORAL | Status: DC

## 2013-11-30 MED ORDER — METOPROLOL SUCCINATE ER 50 MG PO TB24
50.0000 mg | ORAL_TABLET | Freq: Every day | ORAL | Status: DC
  Administered 2013-11-30: 50 mg via ORAL
  Filled 2013-11-30: qty 1

## 2013-11-30 MED ORDER — LISINOPRIL 2.5 MG PO TABS: 2.5000 mg | ORAL_TABLET | Freq: Every day | ORAL | Status: DC

## 2013-11-30 MED ORDER — FUROSEMIDE 40 MG PO TABS: 40.0000 mg | ORAL_TABLET | Freq: Every day | ORAL | Status: DC

## 2013-11-30 NOTE — Consult Note (Signed)
The patient was seen and examined, and I agree with the assessment and plan as documented above, with modifications as noted below. 80 yr old male with what appears to be a prior h/o known LV dysfunction, as pt says "I've been told I have a weak heart before, but it can be treated with medications". He reportedly used to see a cardiologist, Dr. Okey Dupre, in [place], whom he last saw over a year ago. He was admitted with acute decompensated systolic heart failure, EF 20-25%, with biventricular failure, diastolic dysfunction, and what appears to be group 2 pulmonary hypertension. He has been diuresed and is doing better, although he has not ambulated in the hallway yet. His ECG has significant baseline artifact and what appears to be sinus tachycardia, and less likely a junctional tachycardia. He currently denies chest pain and says both his shortness of breath and leg swelling have improved. He tells me he was taken off his diuretic in November. Records have been requested. He is currently being treated with Toprol XL, oral Lasix, statin, and low-dose ACEI. He has CKD, stage III. Given his renal insufficiency (which appears to be at his baseline from available records), and the high likelihood for a contrast-induced nephropathy, I would avoid coronary angiography at present and continue optimal medical management. I will see him in f/u in the office and consider an outpatient nuclear stress test at that time. BP currently 111/76, HR 100 bpm. I would increase Toprol-XL to 50 mg daily and have him ambulate in the hallway. If his O2 saturations remain normal, he may be able to be discharged with close outpatient follow-up. His Hgb is normal, and one could also consider adding ASA 81 mg daily.

## 2013-11-30 NOTE — Care Management Note (Signed)
    Page 1 of 1   11/30/2013     2:49:16 PM   CARE MANAGEMENT NOTE 11/30/2013  Patient:  Calvin Rivas, Calvin Rivas   Account Number:  [id number]  Date Initiated:  11/27/2013  Documentation initiated by:  Sharrie Rothman  Subjective/Objective Assessment:   Pt admitted from home with CHF. Pt lives alone and will return home at discharge. Pt is independent with ADL's. Uses a cane prn.     Action/Plan:   No CM needs noted.   Anticipated DC Date:  11/28/2013   Anticipated DC Plan:  HOME/SELF CARE      DC Planning Services  CM consult      Choice offered to / List presented to:             Status of service:  Completed, signed off Medicare Important Message given?  YES (If response is "NO", the following Medicare IM given date fields will be blank) Date Medicare IM given:  11/27/2013 Date Additional Medicare IM given:  11/30/2013  Discharge Disposition:  HOME/SELF CARE  Per UR Regulation:    If discussed at Long Length of Stay Meetings, dates discussed:    Comments:  11/30/13 1445 Arlyss Queen, RN BSN CM Pt discharged home today. No CM needs noted.  11/27/13 1045 Arlyss Queen, Charity fundraiser BSN CM

## 2013-11-30 NOTE — Discharge Summary (Signed)
Physician Discharge Summary  Calvin Rivas ZOX:[id number] DOB: 05/18/1921 DOA: 11/26/2013  PCP: Calvin BickersRIVEDI,RAJENDRA, MD  Admit date: 11/26/2013 Discharge date: 11/30/2013  Time spent: Less than 30 minutes  Recommendations for Outpatient Follow-up:  1. Dr. Glori Bickersajendra Rivas, PCP in one week with repeat labs (CBC & BMP). 2. Dr. Prentice DockerSuresh Rivas, Cardiology: MDs office will call with followup appointment.  Discharge Diagnoses:  Principal Problem:   Systolic and diastolic CHF, acute Active Problems:   Hypertension   Hypercholesterolemia   Reflux   Acute CHF (congestive heart failure)   CKD (chronic kidney disease), stage III   Hypernatremia   Thrombocytopenia, unspecified   Pedal edema   Cardiomyopathy   Discharge Condition: Improved & Stable  Diet recommendation: Heart healthy diet.  Filed Weights   11/27/13 0556 11/28/13 0522 11/30/13 0900  Weight: 62.959 kg (138 lb 12.8 oz) 61.825 kg (136 lb 4.8 oz) 61.054 kg (134 lb 9.6 oz)    History of present illness:  80 y.o. male with history of hypertension, hypercholesterolemia, reflux, prior mechanical fall associated with multiple left-sided rib fractures, traumatic subarachnoid hemorrhage, acute blood loss anemia and acute kidney injury, presented to the ED on 11/26/13 with complaints of bilateral leg swelling and dyspnea on exertion. He was assessed to have decompensated CHF, mild hypernatremia of unknown etiology and stage III chronic kidney disease.   Hospital Course:   1. Acute systolic and diastolic CHF: Echo 11/27/13 shows moderate LVH, LVEF 20-25% and several wall motion abnormalities. On admission, he had pedal edema, DOE, pulmonary edema and cardiomegaly on chest x-ray and markedly elevated proBNP. Admitted to telemetry. He was started on IV Lasix 40 mg daily and gradually improved. He was transitioned to PO lasix. Cardiology evaluated him today. Today patient remembered that he had been told to have a weak heart which could be  treated with medications alone. He used to [facility] [place] but [facility] and patient had been referred to a new cardiologist but never went. Cardiology states that EKG is suggestive of sinus tachycardia rather than junctional tachycardia. They have increased Toprol XL from 25-50 mg daily. Given his renal insufficiency and high likelihood for contrast induced nephropathy, would like to avoid coronary angiography at present and continue optimal medical management. Discussed with cardiology who have cleared patient for discharge home and they will arrange for outpatient followup and consider an outpatient nuclear stress test. Low dose aspirin was considered but patient has history of fall, head injury and intraventricular bleeding in November 2014. Decision was made to hold off on starting aspirin at this time but will be considered at OP cardiology followup. 2. Cardiomyopathy: Ischemic versus nonischemic. Started low dose lisinopril and continue metoprolol. 3. Hypernatremia: Unclear etiology. Resolved 4. Stage III chronic kidney disease: Creatinine seems to be at baseline.  5. Hypertension: Controlled. Metoprolol increased. 6. Hypercholesterolemia: Continue statins. 7. Thrombocytopenia: Unclear etiology. Stable.   Consultations:  Cardiology  Procedures:  None    Discharge Exam:  Complaints:  Patient denies dyspnea. Leg swellings almost resolved. No chest pain, dizziness or lightheadedness. Ambulated with nursing without issues or hypoxia.  Filed Vitals:   11/29/13 2058 11/30/13 0543 11/30/13 0900 11/30/13 1203  BP: 95/69 111/76  107/62  Pulse: 98 100  100  Temp: 97.8 F (36.6 C) 97.7 F (36.5 C)    TempSrc: Oral     Resp: 20 18    Height:      Weight:   61.054 kg (134 lb 9.6 oz)  SpO2: 97% 96%      General exam: Pleasant elderly male sitting up comfortably on the chair.  Respiratory system: clear to auscultation. No increased work of breathing.   Cardiovascular system: S1 & S2 heard, RRR. No JVD, murmurs, gallops, clicks. Trace ankle edema. Telemetry:ST 100s  Gastrointestinal system: Abdomen is nondistended, soft and nontender. Normal bowel sounds heard.  Central nervous system: Alert and oriented. No focal neurological deficits.  Extremities: Symmetric 5 x 5 power.   Discharge Instructions      Discharge Orders   Future Orders Complete By Expires   (HEART FAILURE PATIENTS) Call MD:  Anytime you have any of the following symptoms: 1) 3 pound weight gain in 24 hours or 5 pounds in 1 week 2) shortness of breath, with or without a dry hacking cough 3) swelling in the hands, feet or stomach 4) if you have to sleep on extra pillows at night in order to breathe.  As directed    Call MD for:  difficulty breathing, headache or visual disturbances  As directed    Call MD for:  extreme fatigue  As directed    Call MD for:  persistant dizziness or light-headedness  As directed    Diet - low sodium heart healthy  As directed    Increase activity slowly  As directed        Medication List         CALCIUM PO  Take 1 tablet by mouth daily.     furosemide 40 MG tablet  Commonly known as:  LASIX  Take 1 tablet (40 mg total) by mouth daily.     lisinopril 2.5 MG tablet  Commonly known as:  PRINIVIL,ZESTRIL  Take 1 tablet (2.5 mg total) by mouth daily.     metoprolol succinate 25 MG 24 hr tablet  Commonly known as:  TOPROL-XL  Take 2 tablets (50 mg total) by mouth daily.     simvastatin 40 MG tablet  Commonly known as:  ZOCOR  Take 1 tablet (40 mg total) by mouth every evening.     TUMS 500 MG chewable tablet  Generic drug:  calcium carbonate  Chew 1 tablet by mouth daily.     Vitamin D (Ergocalciferol) 50000 UNITS Caps capsule  Commonly known as:  DRISDOL  Take 50,000 Units by mouth every 30 (thirty) days.       Follow-up Information   Follow up with Rivas,RAJENDRA, MD. Schedule an appointment as soon as possible for a  visit in 1 week. (To be seen with repeat labs (CBC & BMP).)    Specialty:  Family Medicine   Contact information:   [address], [address] [place] [address] [phone]       Follow up with Laqueta Linden, MD. (MDs office will call with followup appointment. Please call them back if you don't hear from them in the next 2-3 days.)    Specialty:  Cardiology   Contact information:   [address] [place] [address] [phone]        The results of significant diagnostics from this hospitalization (including imaging, microbiology, ancillary and laboratory) are listed below for reference.    Significant Diagnostic Studies: Dg Chest 2 View  11/26/2013   CLINICAL DATA:  Shortness of breath. Lower extremity edema. Prior history of CHF.  EXAM: CHEST  2 VIEW  COMPARISON:  DG CHEST 1V PORT dated 08/26/2013; DG CHEST 1V PORT dated 08/25/2013; DG CHEST 1V PORT dated 08/24/2013; DG CHEST  2 VIEW dated 04/08/2012  FINDINGS: Cardiac silhouette moderately to markedly enlarged but stable. Mild diffuse interstitial pulmonary edema, less severe than on the November, 2014 examinations. Bilateral pleural effusions. No confluent airspace consolidation. Degenerative changes throughout the thoracic spine and slight exaggeration of the usual thoracic kyphosis, unchanged.  IMPRESSION: Mild CHF, with stable moderate to marked cardiomegaly and mild interstitial pulmonary edema. Bilateral pleural effusions.   Electronically Signed   By: Hulan Saas M.D.   On: 11/26/2013 14:05    Microbiology: No results found for this or any previous visit (from the past 240 hour(s)).   Labs: Basic Metabolic Panel:  Recent Labs Lab 11/26/13 1320 11/27/13 0625 11/28/13 0611 11/29/13 0609 11/30/13 0509  NA 148* 146 144 142 144  K 4.8 4.0 3.7 3.8 4.0  CL 105 103 101 98 101  CO2 29 30 31  33* 32  GLUCOSE 122* 103* 99 93 93  BUN 28* 26* 25* 31* 31*  CREATININE 1.64* 1.63* 1.64* 1.71* 1.60*  CALCIUM 9.7 9.1  8.9 8.6 8.7   Liver Function Tests:  Recent Labs Lab 11/26/13 1320  AST 34  ALT 23  ALKPHOS 113  BILITOT 0.7  PROT 7.8  ALBUMIN 3.8   No results found for this basename: LIPASE, AMYLASE,  in the last 168 hours No results found for this basename: AMMONIA,  in the last 168 hours CBC:  Recent Labs Lab 11/26/13 1320 11/27/13 0625 11/28/13 0611  WBC 7.2 5.2 5.2  NEUTROABS 5.1  --   --   HGB 14.3 12.6* 12.4*  HCT 45.1 38.7* 38.6*  MCV 101.3* 99.5 99.7  PLT 126* 112* 112*   Cardiac Enzymes:  Recent Labs Lab 11/26/13 1320  TROPONINI <0.30   BNP: BNP (last 3 results)  Recent Labs  11/26/13 1320  PROBNP 17280.0*   CBG: No results found for this basename: GLUCAP,  in the last 168 hours  Additional labs: 1. TSH: 2.535 2. 2-D echo 11/27/13: Study Conclusions  - Study data: Technically adequate study. - Left ventricle: The cavity size was normal. Wall thickness was increased in a pattern of moderate LVH. Systolic function was severely reduced. The estimated ejection fraction was in the range of 20% to 25%. Findings consistent with left ventricular diastolic dysfunction, grade indeterminant. There is evidence of elevated LA pressure (E/e' 18). - Regional wall motion abnormality: Hypokinesis of the basal-mid anteroseptal, mid inferoseptal, and apical septal myocardium. - Aortic valve: Moderately calcified annulus. Trileaflet; mildly thickened leaflets. Valve area: 1.85cm^2(VTI). Valve area: 1.76cm^2 (Vmax). - Mitral valve: Mildly calcified annulus. Mildly thickened leaflets . Mild regurgitation. - Left atrium: The atrium was moderately dilated. - Right ventricle: The cavity size was moderately to severely dilated. Systolic function was severely reduced. RV TAPSE is 0.6 cm. - Right atrium: The atrium was moderately to severely dilated. - Pulmonary arteries: Systolic pressure was moderately increased in the setting of severe RV dysfunction. PA peak pressure:  55mm Hg (S). - Inferior vena cava: The vessel was dilated; the respirophasic diameter changes were blunted (< 50%); findings are consistent with elevated central venous pressure.     Signed:  Marcellus Scott, MD, FACP, FHM. [facility] Pager [phone]  If 7PM-7AM, please contact night-coverage [email or web address] Password [facility] 11/30/2013, 2:29 PM

## 2013-11-30 NOTE — Progress Notes (Signed)
UR chart review completed.  

## 2013-11-30 NOTE — Consult Note (Signed)
CARDIOLOGY CONSULT NOTE   Patient ID: Ryelan Levengood MRN: [id number] DOB/AGE: 78-Nov-1922 78 y.o.  Admit Date: 11/26/2013 Referring Physician: PTH Primary Physician: Glori Bickers, MD Consulting Cardiologist:Koneswaran, Darliss Ridgel MD Primary Cardiologist: Cherene Julian MD  Reason for Consultation: Systolic Dysfunction and pulmonary edema.  Clinical Summary Mr. Morelan is a 80 y.o.male with no prior documented history of CAD, but has been followed by Dr. Elmer Picker via [facility] in [place] office, with history of CHF,  hypertension, hypercholesterolemia, GERD, mechanical fall sustaining a subarachnoid hemorrhage,fx ribs on the left and  acute blood loss anemia, renal failure, admitted after presenting to ER with LEE, DOE and found to have pulmonary edema. Echo completed on 11/27/2013 demonstrated EF of 20-25% which is an apparently new finding. We are asked for cardiac recommendations.   He states he was admitted one year ago with LEE and was treated with IV diuretics, but after fall in November of 2014, he was taken off of his diuretic. He followed up with Dr. Elmer Picker and had 3 visits with him, and was told he would be treated medically. Has not seen Dr. Elmer Picker in one year as he stopped coming to [place]. Records have been requested.     Symptoms began approx 2 weeks prior to admission with LEE at first progressing to abdominal distention.On arrival to ER BP was 123/91. Na 148, K+ 4.8, 105, Creat 1.64. Pro-BNP 17, 280. CXR mild CHF with stable moderate to marked cardiomegaly with mild interstitial pulmonary edema. He was treated with IV lasix. Wt 139 on admission.    He has diuresed and is feeling better. Denies chest pain or DOE, overall weakness is remaining.  He remains on PO lasix 40 mg daily, lisinopril 2.5, and metoprolol 25 mg daily. (On review of discharge medications in Nov 2014, he had been taken off of spironolactone, losartan, meloxicam, and ASA).       Allergies  Allergen  Reactions  . Penicillins Rash and Other (See Comments)    Pt states dr told him if he took penicillin again "it could be the last time"  . Castor Oil     Pt. States he has seizures from ingesting it    Medications Scheduled Medications: . calcium carbonate  1 tablet Oral Daily  . furosemide  40 mg Oral Daily  . lisinopril  2.5 mg Oral Daily  . metoprolol succinate  25 mg Oral Daily  . simvastatin  40 mg Oral QPM  . sodium chloride  3 mL Intravenous Q12H       PRN Medications: acetaminophen, acetaminophen, albuterol, ondansetron (ZOFRAN) IV, ondansetron   Past Medical History  Diagnosis Date  . Hypertension   . Hypercholesterolemia   . Reflux     History reviewed. No pertinent past surgical history.  History reviewed. No pertinent family history.  Social History Mr. Saxby reports that he has never smoked. He does not have any smokeless tobacco history on file. Mr. Gregorio reports that he does not drink alcohol.  Review of Systems Otherwise reviewed and negative except as outlined.  Physical Examination Blood pressure 111/76, pulse 100, temperature 97.7 F (36.5 C), temperature source Oral, resp. rate 18, height 5\' 5"  (1.651 m), weight 136 lb 4.8 oz (61.825 kg), SpO2 96.00%.  Intake/Output Summary (Last 24 hours) at 11/30/13 0921 Last data filed at 11/29/13 1746  Gross per 24 hour  Intake    360 ml  Output      0 ml  Net    360 ml  Telemetry:  HEENT: Conjunctiva and lids normal, oropharynx clear with moist mucosa. Neck: Supple, no elevated JVP or carotid bruits, no thyromegaly. Lungs: Clear to auscultation, nonlabored breathing at rest. Cardiac: Regular rate and rhythm, no S3 or significant systolic murmur, no pericardial rub. Abdomen: Soft, nontender, no hepatomegaly, bowel sounds present, no guarding or rebound. Extremities: No pitting edema, distal pulses 2+. Skin: Warm and dry. Musculoskeletal: No kyphosis. Neuropsychiatric: Alert and oriented x3,  affect grossly appropriate.  Prior Cardiac Testing/Procedures 1. Echocardiogram 11/27/2013 Study data: Technically adequate study. - Left ventricle: The cavity size was normal. Wall thickness was increased in a pattern of moderate LVH. Systolic function was severely reduced. The estimated ejection fraction was in the range of 20% to 25%. Findings consistent with left ventricular diastolic dysfunction, grade indeterminant. There is evidence of elevated LA pressure (E/e' 18). - Regional wall motion abnormality: Hypokinesis of the basal-mid anteroseptal, mid inferoseptal, and apical septal myocardium. - Aortic valve: Moderately calcified annulus. Trileaflet; mildly thickened leaflets. Valve area: 1.85cm^2(VTI). Valve area: 1.76cm^2 (Vmax). - Mitral valve: Mildly calcified annulus. Mildly thickened leaflets . Mild regurgitation. - Left atrium: The atrium was moderately dilated. - Right ventricle: The cavity size was moderately to severely dilated. Systolic function was severely reduced. RV TAPSE is 0.6 cm. - Right atrium: The atrium was moderately to severely dilated. - Pulmonary arteries: Systolic pressure was moderately increased in the setting of severe RV dysfunction. PA peak pressure: 60mm Hg (S). - Inferior vena cava: The vessel was dilated; the respirophasic diameter changes were blunted (< 50%); findings are consistent with elevated central venous pressure.  Lab Results  Basic Metabolic Panel:  Recent Labs Lab 11/26/13 1320 11/27/13 0625 11/28/13 0611 11/29/13 0609 11/30/13 0509  NA 148* 146 144 142 144  K 4.8 4.0 3.7 3.8 4.0  CL 105 103 101 98 101  CO2 29 30 31  33* 32  GLUCOSE 122* 103* 99 93 93  BUN 28* 26* 25* 31* 31*  CREATININE 1.64* 1.63* 1.64* 1.71* 1.60*  CALCIUM 9.7 9.1 8.9 8.6 8.7    Liver Function Tests:  Recent Labs Lab 11/26/13 1320  AST 34  ALT 23  ALKPHOS 113  BILITOT 0.7  PROT 7.8  ALBUMIN 3.8    CBC:  Recent Labs Lab  11/26/13 1320 11/27/13 0625 11/28/13 0611  WBC 7.2 5.2 5.2  NEUTROABS 5.1  --   --   HGB 14.3 12.6* 12.4*  HCT 45.1 38.7* 38.6*  MCV 101.3* 99.5 99.7  PLT 126* 112* 112*    Cardiac Enzymes:  Recent Labs Lab 11/26/13 1320  TROPONINI <0.30    BNP: 17280.0   Radiology: CXR 11/26/2013  Cardiac silhouette moderately to markedly enlarged but stable. Mild diffuse interstitial pulmonary edema, less severe than on the November, 2014 examinations. Bilateral pleural effusions. No confluent airspace consolidation. Degenerative changes throughout the thoracic spine and slight exaggeration of the usual thoracic kyphosis, unchanged.  IMPRESSION: Mild CHF, with stable moderate to marked cardiomegaly and mild interstitial pulmonary edema. Bilateral pleural effusions.    ECG: Sinus tachycardia vs accelerated junctional rhythm (will confirm with Dr. Kirtland Bouchard)   Impression and Recommendations  1.Acute systolic CHF: Echo this admission demonstrated EF of 20%-25%, with mild pulmonary edema. He has been seen by cardiologist from [facility] via [place] office. Not certain that his a new finding. He denies prior cardiac cath, and is unsure if he has had a stress test or echo in the past. Records are requested.   He is usually very active, walking and  visiting friends in the nursing home. His wet is down 4.5 lbs since admission with lasix. He will be continued on ACE, consider changing metoprolol to carvedilol. Creatinine is at baseline on review of past admission in November. Will review records once they are available. Ischemic testing is not planned at this time.   2. Significant RV dysfunction: No history of PE or lung disease in his past history, although CXR demonstrated chronic interstitial lung disease. Can consider R & L heart cath at Dr. Junius ArgyleKoneswaran's discretion if medically necessary. Doubt will change treatment regimen.   3. Hypertension: BP is controlled. Low normal expected with reduced  LVEF.  4. Thrombocytopenia  5. CKD: Consider cardiorenal syndrome.         Signed: Bettey MareKathryn M. Lyman BishopLawrence NP [facility] 11/30/2013, 9:21 AM Co-Sign MD

## 2013-11-30 NOTE — Progress Notes (Signed)
Patient discharged home with instructions given on medications,and follow up visits,patient verbalized understanding.Prescriptions sent with patient. No c/o pain or discomfort noted. Accompanied by staff to an awaiting vehicle. 

## 2013-12-21 ENCOUNTER — Telehealth: Payer: Self-pay | Admitting: Cardiovascular Disease

## 2013-12-21 NOTE — Telephone Encounter (Signed)
Lupita Leash (caregiver) called and states that patient is very weak, having diarrhea. Wants to know If new medication (furosemide) 40 mg is causing these problems.  # [phone]

## 2013-12-21 NOTE — Telephone Encounter (Signed)
Attempted to return call to number provided below.  Number is not in service.    Attempted to reach patient at home number.  Husband of caregiver Lupita Leash) stated this was her home number in [place].  He clarified number below:  [phone].  Stated here currently taking care of her uncle.    Phone call to Lupita Leash (caregiver) - Increased intake of fluids yesterday due to diarrhea.  Taking Furosemide 40mg  daily per PMD.  Tired & fatigued all the time.  States the diarrhea is doing better today though.  Advised to give Gatorade/G2 to replenish electrolytes in the meantime.  Also, may want to check BP to see how numbers are running since he felt "wobbly" yesterday.  Stated that she does not have monitor here with her.  Advised that if symptoms worsen & she feels like he can't wait till we see him here on 3/18 with Dr. [name], take him to ED for evaluation.  If she buys BP monitor, she may call back with readings.

## 2013-12-21 NOTE — Telephone Encounter (Signed)
If he is having diarrhea, he may be suffering from a viral gastroenteritis. I would have him see his PCP for stool cultures. While he is having diarrhea, Lasix can be held. Need to closely monitor for leg swelling, shortness of breath, and weight gain being off of Lasix though.

## 2013-12-22 NOTE — Telephone Encounter (Signed)
Diarhhea is doing better.  Did take to urgent care for blood pressure check - 110/68.  Only went once yesterday & firmer.  Will continue as previous since his OV is tomorrow.

## 2013-12-23 ENCOUNTER — Encounter: Payer: Self-pay | Admitting: Cardiovascular Disease

## 2013-12-23 ENCOUNTER — Ambulatory Visit (INDEPENDENT_AMBULATORY_CARE_PROVIDER_SITE_OTHER): Payer: Medicare Other | Admitting: Cardiovascular Disease

## 2013-12-23 VITALS — BP 112/71 | HR 100 | Ht 64.0 in | Wt 126.8 lb

## 2013-12-23 DIAGNOSIS — E785 Hyperlipidemia, unspecified: Secondary | ICD-10-CM

## 2013-12-23 DIAGNOSIS — I2589 Other forms of chronic ischemic heart disease: Secondary | ICD-10-CM

## 2013-12-23 DIAGNOSIS — I519 Heart disease, unspecified: Secondary | ICD-10-CM

## 2013-12-23 DIAGNOSIS — N183 Chronic kidney disease, stage 3 unspecified: Secondary | ICD-10-CM

## 2013-12-23 DIAGNOSIS — I509 Heart failure, unspecified: Secondary | ICD-10-CM

## 2013-12-23 DIAGNOSIS — I5023 Acute on chronic systolic (congestive) heart failure: Secondary | ICD-10-CM

## 2013-12-23 MED ORDER — METOPROLOL SUCCINATE ER 25 MG PO TB24: 75.0000 mg | ORAL_TABLET | Freq: Every day | ORAL | Status: DC

## 2013-12-23 MED ORDER — ASPIRIN EC 81 MG PO TBEC: 81.0000 mg | DELAYED_RELEASE_TABLET | Freq: Every day | ORAL | Status: DC

## 2013-12-23 NOTE — Patient Instructions (Addendum)
   Referral to Home Health - will call when have more information  Begin Aspirin 81mg  daily  Increase Metoprol Succ to 75mg  daily (take 3 tabs of your 25mg  tablets)  #30 tabs sent to local pharm ([facility])  #270 tabs / 90 day supply sent to mail order - [facility] Continue all other medications.    Chest x-ray   Lab for BMET  Office will contact with results via phone or letter.    Follow up in  6 weeks

## 2013-12-23 NOTE — Progress Notes (Signed)
Patient ID: Calvin Rivas, male   DOB: [birth year], 80 y.o.   MRN: [id number]      SUBJECTIVE: The patient is a 80 year old male with severe left ventricular systolic dysfunction, EF 20-25%. He was hospitalized earlier this year for acute decompensated systolic heart failure with biventricular failure and diastolic dysfunction, for which I saw him in consultation. He has CKD stage III and group 2 pulmonary hypertension. He has recently been suffering from diarrhea. He lives alone. His niece from [place] is here today at his office visit. She calls him once a week. He has previously refused home health with concerns that someone may steal from him. His niece tells me that he is not as perky as he usually is. He currently denies chest pain, shortness of breath, palpitations and leg swelling. His niece has been encouraging him to drink more water and Gatorade due to his ongoing problems with diarrhea. His PCP has limited his fluid intake to 1.3 liters daily. When he increased his fluid intake, it appeared that his energy levels had improved. He was instructed to hold Lasix when he was having diarrhea, but he failed to do so.   Allergies  Allergen Reactions  . Penicillins Rash and Other (See Comments)    Pt states dr told him if he took penicillin again "it could be the last time"  . Castor Oil     Pt. States he has seizures from ingesting it    Current Outpatient Prescriptions  Medication Sig Dispense Refill  . calcium carbonate (TUMS) 500 MG chewable tablet Chew 1 tablet by mouth daily.      Marland Kitchen CALCIUM PO Take 1 tablet by mouth daily.      . Fe Fum-FA-B Cmp-C-Zn-Mg-Mn-Cu (HEMATINIC PLUS COMPLEX) 106-1 MG TABS Take 1 tablet by mouth daily.      . furosemide (LASIX) 40 MG tablet Take 1 tablet (40 mg total) by mouth daily.  30 tablet  0  . lisinopril (PRINIVIL,ZESTRIL) 2.5 MG tablet Take 1 tablet (2.5 mg total) by mouth daily.  30 tablet  0  . metoprolol succinate (TOPROL-XL) 25 MG 24 hr tablet Take  2 tablets (50 mg total) by mouth daily.  60 tablet  0  . simvastatin (ZOCOR) 40 MG tablet Take 1 tablet (40 mg total) by mouth every evening.  30 tablet  0  . Vitamin D, Ergocalciferol, (DRISDOL) 50000 UNITS CAPS capsule Take 50,000 Units by mouth every 30 (thirty) days.        No current facility-administered medications for this visit.    Past Medical History  Diagnosis Date  . Hypertension   . Hypercholesterolemia   . Reflux     No past surgical history on file.  History   Social History  . Marital Status: Widowed    Spouse Name: N/A    Number of Children: N/A  . Years of Education: N/A   Occupational History  . Not on file.   Social History Main Topics  . Smoking status: Never Smoker   . Smokeless tobacco: Not on file  . Alcohol Use: No  . Drug Use: No  . Sexual Activity: No   Other Topics Concern  . Not on file   Social History Narrative  . No narrative on file     Filed Vitals:   12/23/13 1101  BP: 112/71  Pulse: 100  Height: 5\' 4"  (1.626 m)  Weight: 126 lb 12.8 oz (57.516 kg)  SpO2: 97%    PHYSICAL EXAM General: NAD Neck:  No JVD, no thyromegaly. Lungs: Faint bibasilar crackles with normal respiratory effort. CV: Nondisplaced PMI.  Regular rate and rhythm, normal S1/S2, no S3/S4, no murmur. No pretibial or periankle edema.  No carotid bruit.  Normal pedal pulses.  Abdomen: Soft, nontender, no hepatosplenomegaly, no distention.  Neurologic: Alert and oriented x 3.  Psych: Normal affect. Extremities: No clubbing or cyanosis.   ECG: reviewed and available in electronic records.      ASSESSMENT AND PLAN: 1. Chronic systolic heart failure: I will obtain a chest xray and BMET. I have instructed him to hold Lasix only on the days that he is having problems with repetitive diarrheal episodes. I am also going to try to enroll him with home healthcare services, and he is now amenable to this with his niece's encouragement. I will increase Toprol-XL to  75 mg daily. I would consider a nuclear stress test to evaluate for an ischemic etiology in the future. Continue ACEI and statin therapy. Will start ASA 81 mg daily. 2. CKD stage III: will check a BMET. 3. Hyperlipidemia: continue simvastatin 40 mg daily.  Dispo: f/u 6 weeks.  Prentice DockerSuresh Nara Paternoster, M.D., F.A.C.C.

## 2013-12-25 ENCOUNTER — Telehealth: Payer: Self-pay | Admitting: Cardiovascular Disease

## 2013-12-25 NOTE — Telephone Encounter (Signed)
Has many quesitons about medication and whether or not he needs to take an aspirin a day or not.   Inocencio Homes was suppose to get back with her this week about a home nurse. Lupita Leash is leaving this weekend.

## 2013-12-25 NOTE — Telephone Encounter (Signed)
Call placed to [facility]) [phone].    Not sure what services they could offer.  If he does not have a skilled need, the only thing they might could go out and do would be for teaching.  But, this would not be long term.  She will discuss with staff & call back.

## 2013-12-25 NOTE — Telephone Encounter (Signed)
Left message to return call 

## 2013-12-25 NOTE — Telephone Encounter (Signed)
Also, informed her that he should be on 81mg  Aspirin daily per Dr. [name].

## 2013-12-25 NOTE — Telephone Encounter (Signed)
Jola Babinski w/ [facility] returned call - stated that she discussed this with her staff members, nothing they can offer at this time.  May benefit from a private sitter.    Lupita Leash (niece) notified of above.

## 2013-12-30 ENCOUNTER — Other Ambulatory Visit: Payer: Self-pay | Admitting: *Deleted

## 2013-12-30 ENCOUNTER — Encounter: Payer: Self-pay | Admitting: *Deleted

## 2013-12-30 DIAGNOSIS — I10 Essential (primary) hypertension: Secondary | ICD-10-CM

## 2013-12-30 DIAGNOSIS — I509 Heart failure, unspecified: Secondary | ICD-10-CM

## 2013-12-30 DIAGNOSIS — N183 Chronic kidney disease, stage 3 unspecified: Secondary | ICD-10-CM

## 2013-12-30 DIAGNOSIS — R7989 Other specified abnormal findings of blood chemistry: Secondary | ICD-10-CM

## 2013-12-31 ENCOUNTER — Other Ambulatory Visit: Payer: Self-pay | Admitting: *Deleted

## 2013-12-31 MED ORDER — LISINOPRIL 2.5 MG PO TABS: 2.5000 mg | ORAL_TABLET | Freq: Every day | ORAL | Status: DC

## 2014-01-18 ENCOUNTER — Telehealth: Payer: Self-pay | Admitting: Cardiovascular Disease

## 2014-01-18 NOTE — Telephone Encounter (Signed)
Needs to speak with you.  Still having issues trying to the correct refills for Calvin Rivas.

## 2014-01-19 NOTE — Telephone Encounter (Signed)
Discussed with Lupita Leash (niece) - stated he has not received his Metoprolol Succ 25mg  - 3 tabs daily.    Placed call to [facility] - stated that his medication was in the dispensing area now & usually is sent out in 3-5 business days after this point.    Lupita Leash notified of above.

## 2014-02-10 ENCOUNTER — Ambulatory Visit: Payer: Medicare Other | Admitting: Cardiovascular Disease

## 2014-03-02 ENCOUNTER — Encounter: Payer: Self-pay | Admitting: Cardiovascular Disease

## 2014-03-02 ENCOUNTER — Ambulatory Visit (INDEPENDENT_AMBULATORY_CARE_PROVIDER_SITE_OTHER): Payer: Medicare Other | Admitting: Cardiovascular Disease

## 2014-03-02 VITALS — BP 146/82 | HR 50 | Ht 64.0 in | Wt 131.0 lb

## 2014-03-02 DIAGNOSIS — I2589 Other forms of chronic ischemic heart disease: Secondary | ICD-10-CM

## 2014-03-02 DIAGNOSIS — I10 Essential (primary) hypertension: Secondary | ICD-10-CM

## 2014-03-02 DIAGNOSIS — I519 Heart disease, unspecified: Secondary | ICD-10-CM

## 2014-03-02 DIAGNOSIS — R7989 Other specified abnormal findings of blood chemistry: Secondary | ICD-10-CM

## 2014-03-02 DIAGNOSIS — E785 Hyperlipidemia, unspecified: Secondary | ICD-10-CM

## 2014-03-02 DIAGNOSIS — I5022 Chronic systolic (congestive) heart failure: Secondary | ICD-10-CM

## 2014-03-02 DIAGNOSIS — R799 Abnormal finding of blood chemistry, unspecified: Secondary | ICD-10-CM

## 2014-03-02 DIAGNOSIS — N183 Chronic kidney disease, stage 3 unspecified: Secondary | ICD-10-CM

## 2014-03-02 MED ORDER — METOPROLOL SUCCINATE ER 25 MG PO TB24: 50.0000 mg | ORAL_TABLET | Freq: Every day | ORAL | Status: DC

## 2014-03-02 NOTE — Patient Instructions (Signed)
   Decrease Toprol XL to 50mg  daily  Continue all other medications.   Follow up in  3 months

## 2014-03-02 NOTE — Progress Notes (Signed)
Patient ID: Sonia BallerWalter S Caesar, male   DOB: 01-Dec-1920, 80 y.o.   MRN: [id number]      SUBJECTIVE: The patient is a 80 year old male with severe left ventricular systolic dysfunction, EF 20-25%. He was hospitalized earlier this year for acute decompensated systolic heart failure with biventricular failure and diastolic dysfunction, for which I saw him in consultation. He has CKD stage III and group 2 pulmonary hypertension. At his last visit, he had evidence of mild pulmonary edema for which Lasix was provided and renal function was assessed. He now takes Lasix 40 mg as needed for leg swelling. He continues to have episodic diarrhea. He denies chest pain and dizziness.     Allergies  Allergen Reactions  . Penicillins Rash and Other (See Comments)    Pt states dr told him if he took penicillin again "it could be the last time"  . Castor Oil     Pt. States he has seizures from ingesting it    Current Outpatient Prescriptions  Medication Sig Dispense Refill  . calcium carbonate (TUMS) 500 MG chewable tablet Chew 1 tablet by mouth daily.      Marland Kitchen. CALCIUM PO Take 1 tablet by mouth daily.      . Fe Fum-FA-B Cmp-C-Zn-Mg-Mn-Cu (HEMATINIC PLUS COMPLEX) 106-1 MG TABS Take 1 tablet by mouth daily.      . furosemide (LASIX) 40 MG tablet Take 40 mg by mouth daily as needed.      Marland Kitchen. lisinopril (PRINIVIL,ZESTRIL) 2.5 MG tablet Take 1 tablet (2.5 mg total) by mouth daily.  30 tablet  6  . metoprolol succinate (TOPROL-XL) 25 MG 24 hr tablet Take 3 tablets (75 mg total) by mouth daily.  270 tablet  3  . simvastatin (ZOCOR) 40 MG tablet Take 1 tablet (40 mg total) by mouth every evening.  30 tablet  0  . Vitamin D, Ergocalciferol, (DRISDOL) 50000 UNITS CAPS capsule Take 50,000 Units by mouth every 30 (thirty) days.        No current facility-administered medications for this visit.    Past Medical History  Diagnosis Date  . Hypertension   . Hypercholesterolemia   . Reflux     No past surgical history  on file.  History   Social History  . Marital Status: Widowed    Spouse Name: N/A    Number of Children: N/A  . Years of Education: N/A   Occupational History  . Not on file.   Social History Main Topics  . Smoking status: Never Smoker   . Smokeless tobacco: Never Used  . Alcohol Use: No  . Drug Use: No  . Sexual Activity: No   Other Topics Concern  . Not on file   Social History Narrative  . No narrative on file     Filed Vitals:   03/02/14 1526  BP: 146/82  Pulse: 50  Height: 5\' 4"  (1.626 m)  Weight: 131 lb (59.421 kg)    PHYSICAL EXAM General: NAD Neck: No JVD, no thyromegaly. Lungs: Clear to auscultation bilaterally with normal respiratory effort. CV: Nondisplaced PMI.  Regular rate and rhythm, normal S1/S2, no S3/S4, no murmur. No pretibial or periankle edema.  No carotid bruit.  Normal pedal pulses.  Abdomen: Soft, nontender, no hepatosplenomegaly, no distention.  Neurologic: Alert and oriented x 3.  Psych: Normal affect. Extremities: No clubbing or cyanosis.   ECG: reviewed and available in electronic records.      ASSESSMENT AND PLAN: 1. Chronic systolic heart failure: Appears to be  compensated. I have instructed him to take Lasix 40 mg as needed for leg swelling and increasing shortness of breath. He had previously had problems with dehydration due to concomitant diarrhea and daily Lasix use. I will reduce Toprol-XL to 50 mg daily as he bradycardic, but not particularly symptomatic. I would consider a nuclear stress test to evaluate for an ischemic etiology in the future. Continue ACEI and statin therapy. Will continue ASA 81 mg daily.  2. CKD stage III: BMET showed BUN 71, creatinine 1.95 on 3/18. Repeat BMET was not obtained by patient. 3. Hyperlipidemia: Continue simvastatin 40 mg daily.   Dispo: f/u 3 months.   Prentice Docker, M.D., F.A.C.C.

## 2014-03-22 ENCOUNTER — Other Ambulatory Visit: Payer: Self-pay | Admitting: *Deleted

## 2014-06-10 ENCOUNTER — Encounter: Payer: Self-pay | Admitting: Cardiovascular Disease

## 2014-06-10 ENCOUNTER — Ambulatory Visit (INDEPENDENT_AMBULATORY_CARE_PROVIDER_SITE_OTHER): Payer: Medicare Other | Admitting: Cardiovascular Disease

## 2014-06-10 VITALS — BP 113/51 | HR 55 | Ht 64.0 in | Wt 122.0 lb

## 2014-06-10 DIAGNOSIS — I519 Heart disease, unspecified: Secondary | ICD-10-CM

## 2014-06-10 DIAGNOSIS — N183 Chronic kidney disease, stage 3 unspecified: Secondary | ICD-10-CM

## 2014-06-10 DIAGNOSIS — I5022 Chronic systolic (congestive) heart failure: Secondary | ICD-10-CM

## 2014-06-10 DIAGNOSIS — I10 Essential (primary) hypertension: Secondary | ICD-10-CM

## 2014-06-10 DIAGNOSIS — R001 Bradycardia, unspecified: Secondary | ICD-10-CM

## 2014-06-10 DIAGNOSIS — E785 Hyperlipidemia, unspecified: Secondary | ICD-10-CM

## 2014-06-10 DIAGNOSIS — I498 Other specified cardiac arrhythmias: Secondary | ICD-10-CM

## 2014-06-10 DIAGNOSIS — R5383 Other fatigue: Secondary | ICD-10-CM

## 2014-06-10 DIAGNOSIS — R5381 Other malaise: Secondary | ICD-10-CM

## 2014-06-10 DIAGNOSIS — I2589 Other forms of chronic ischemic heart disease: Secondary | ICD-10-CM

## 2014-06-10 MED ORDER — METOPROLOL SUCCINATE ER 25 MG PO TB24: 25.0000 mg | ORAL_TABLET | Freq: Every day | ORAL | Status: DC

## 2014-06-10 NOTE — Patient Instructions (Signed)
   Decrease Toprol XL to 25mg  daily  Continue all other medications.   Follow up in  3 months

## 2014-06-10 NOTE — Progress Notes (Signed)
Patient ID: Calvin Rivas, male   DOB: 29-Apr-1921, 80 y.o.   MRN: [id number]      SUBJECTIVE: The patient is a 80 year old male with severe left ventricular systolic dysfunction, EF 20-25%. He was hospitalized earlier this year for acute decompensated systolic heart failure with biventricular failure and diastolic dysfunction, for which I saw him in consultation. He has CKD stage III and group 2 pulmonary hypertension. He lives alone. His niece from [place] is here today at his office visit. She calls him once a week and visits every other month. He denies chest pain and shortness of breath. His weight is stable. He has felt more fatigued lately. His resting heart rate is 55 beats per minute and he is on Toprol-XL 50 mg daily. He denies orthopnea and leg swelling. He does not have much of an appetite. He has some left leg weakness and is scheduled to see a neurologist and undergo testing.    Review of Systems: As per "subjective", otherwise negative.  Allergies  Allergen Reactions  . Penicillins Rash and Other (See Comments)    Pt states dr told him if he took penicillin again "it could be the last time"  . Castor Oil     Pt. States he has seizures from ingesting it    Current Outpatient Prescriptions  Medication Sig Dispense Refill  . calcium carbonate (TUMS) 500 MG chewable tablet Chew 1 tablet by mouth daily.      Marland Kitchen CALCIUM PO Take 1 tablet by mouth daily.      . Fe Fum-FA-B Cmp-C-Zn-Mg-Mn-Cu (HEMATINIC PLUS COMPLEX) 106-1 MG TABS Take 1 tablet by mouth daily.      . furosemide (LASIX) 40 MG tablet Take 40 mg by mouth daily as needed.      Marland Kitchen lisinopril (PRINIVIL,ZESTRIL) 2.5 MG tablet Take 1 tablet (2.5 mg total) by mouth daily.  30 tablet  6  . metoprolol succinate (TOPROL-XL) 25 MG 24 hr tablet Take 2 tablets (50 mg total) by mouth daily.      . simvastatin (ZOCOR) 40 MG tablet Take 1 tablet (40 mg total) by mouth every evening.  30 tablet  0  . Vitamin D, Ergocalciferol,  (DRISDOL) 50000 UNITS CAPS capsule Take 50,000 Units by mouth every 30 (thirty) days.        No current facility-administered medications for this visit.    Past Medical History  Diagnosis Date  . Hypertension   . Hypercholesterolemia   . Reflux     No past surgical history on file.  History   Social History  . Marital Status: Widowed    Spouse Name: N/A    Number of Children: N/A  . Years of Education: N/A   Occupational History  . Not on file.   Social History Main Topics  . Smoking status: Never Smoker   . Smokeless tobacco: Never Used  . Alcohol Use: No  . Drug Use: No  . Sexual Activity: No   Other Topics Concern  . Not on file   Social History Narrative  . No narrative on file     Filed Vitals:   06/10/14 1314  Height: 5\' 4"  (1.626 m)   Wt 122 lbs (previously 131 lbs) BP 113/51 Pulse 55   PHYSICAL EXAM General: NAD, elderly, frail HEENT: Normal. Neck: No JVD, no thyromegaly. Lungs: Faint, intermittent left basilar crackles with normal respiratory effort. CV: Nondisplaced PMI.  Regular rate and rhythm, normal S1/S2, +S3, no S4, no murmur. No pretibial or periankle  edema.   Abdomen: Soft, nontender, no hepatosplenomegaly, no distention.  Neurologic: Alert and oriented x 3.  Psych: Normal affect. Skin: Normal. Musculoskeletal: Normal range of motion, no gross deformities. Ambulates with cane. Extremities: No clubbing or cyanosis.   ECG: Most recent ECG reviewed.      ASSESSMENT AND PLAN: 1. Chronic systolic heart failure: Appears to be compensated. I have instructed him to take Lasix 40 mg as needed for leg swelling and increasing shortness of breath. He had previously had problems with dehydration due to concomitant diarrhea and daily Lasix use. I will reduce Toprol-XL to 25 mg daily for bradycardia and fatigue. I would consider a nuclear stress test to evaluate for an ischemic etiology in the future. Continue ACEI and statin therapy and ASA 81  mg daily.   2. CKD stage III: BMET showed BUN 71, creatinine 1.95 on 3/18. Repeat BMET was not obtained by patient.   3. Hyperlipidemia: Continue simvastatin 40 mg daily.   4. Fatigue: as per #1.  Dispo: f/u 3-4 months.  Prentice Docker, M.D., F.A.C.C.

## 2014-08-23 ENCOUNTER — Other Ambulatory Visit: Payer: Self-pay | Admitting: *Deleted

## 2014-08-23 MED ORDER — LISINOPRIL 2.5 MG PO TABS: 2.5000 mg | ORAL_TABLET | Freq: Every day | ORAL | Status: DC

## 2014-08-23 NOTE — Telephone Encounter (Signed)
Lisinopril sent to [facility] [place], [place].

## 2014-09-09 ENCOUNTER — Ambulatory Visit (INDEPENDENT_AMBULATORY_CARE_PROVIDER_SITE_OTHER): Payer: Medicare Other | Admitting: Cardiovascular Disease

## 2014-09-09 ENCOUNTER — Encounter: Payer: Self-pay | Admitting: Cardiovascular Disease

## 2014-09-09 VITALS — BP 112/67 | HR 55 | Ht 64.0 in | Wt 120.0 lb

## 2014-09-09 DIAGNOSIS — I5023 Acute on chronic systolic (congestive) heart failure: Secondary | ICD-10-CM

## 2014-09-09 DIAGNOSIS — I255 Ischemic cardiomyopathy: Secondary | ICD-10-CM

## 2014-09-09 DIAGNOSIS — I10 Essential (primary) hypertension: Secondary | ICD-10-CM

## 2014-09-09 DIAGNOSIS — M7989 Other specified soft tissue disorders: Secondary | ICD-10-CM

## 2014-09-09 DIAGNOSIS — I519 Heart disease, unspecified: Secondary | ICD-10-CM

## 2014-09-09 DIAGNOSIS — I5041 Acute combined systolic (congestive) and diastolic (congestive) heart failure: Secondary | ICD-10-CM

## 2014-09-09 DIAGNOSIS — E785 Hyperlipidemia, unspecified: Secondary | ICD-10-CM

## 2014-09-09 MED ORDER — MEDICAL COMPRESSION STOCKINGS MISC: 1.0000 | Status: DC

## 2014-09-09 MED ORDER — FUROSEMIDE 40 MG PO TABS: 40.0000 mg | ORAL_TABLET | Freq: Two times a day (BID) | ORAL | Status: AC

## 2014-09-09 MED ORDER — FUROSEMIDE 40 MG PO TABS: 40.0000 mg | ORAL_TABLET | Freq: Every day | ORAL | Status: DC

## 2014-09-09 NOTE — Progress Notes (Signed)
Patient ID: JAMMES RIZER, male   DOB: 01/06/1921, 80 y.o.   MRN: [id number]      SUBJECTIVE: The patient is a 80 year old male with severe left ventricular systolic dysfunction, EF 20-25%. He was hospitalized earlier this year for acute decompensated systolic heart failure with biventricular failure and diastolic dysfunction, for which I saw him in consultation. He has CKD stage III and group 2 pulmonary hypertension. He lives alone. His niece from [place], [place] (Murvin Natal) is here today at his office visit. She calls him once a week and visits every other month. He denies chest pain and shortness of breath. His weight is stable. He has had some leg swelling for the past few days.   Review of Systems: As per "subjective", otherwise negative.  Allergies  Allergen Reactions  . Penicillins Rash and Other (See Comments)    Pt states dr told him if he took penicillin again "it could be the last time"  . Castor Oil     Pt. States he has seizures from ingesting it    Current Outpatient Prescriptions  Medication Sig Dispense Refill  . amitriptyline (ELAVIL) 10 MG tablet Take 10 mg by mouth at bedtime as needed for sleep.    . calcium carbonate (TUMS) 500 MG chewable tablet Chew 1 tablet by mouth daily.    Marland Kitchen CALCIUM PO Take 1 tablet by mouth daily.    Marland Kitchen doxepin (SINEQUAN) 10 MG capsule Take 10 mg by mouth at bedtime.    . Fe Fum-FA-B Cmp-C-Zn-Mg-Mn-Cu (HEMATINIC PLUS COMPLEX) 106-1 MG TABS Take 1 tablet by mouth daily.    . furosemide (LASIX) 40 MG tablet Take 40 mg by mouth daily.     Marland Kitchen lisinopril (PRINIVIL,ZESTRIL) 2.5 MG tablet Take 1 tablet (2.5 mg total) by mouth daily. 30 tablet 6  . metoprolol succinate (TOPROL-XL) 25 MG 24 hr tablet Take 1 tablet (25 mg total) by mouth daily.    . mirtazapine (REMERON) 7.5 MG tablet Take 7.5 mg by mouth at bedtime.    . simvastatin (ZOCOR) 40 MG tablet Take 1 tablet (40 mg total) by mouth every evening. 30 tablet 0  . Vitamin D,  Ergocalciferol, (DRISDOL) 50000 UNITS CAPS capsule Take 50,000 Units by mouth every 30 (thirty) days.      No current facility-administered medications for this visit.    Past Medical History  Diagnosis Date  . Hypertension   . Hypercholesterolemia   . Reflux     No past surgical history on file.  History   Social History  . Marital Status: Widowed    Spouse Name: N/A    Number of Children: N/A  . Years of Education: N/A   Occupational History  . Not on file.   Social History Main Topics  . Smoking status: Never Smoker   . Smokeless tobacco: Never Used  . Alcohol Use: No  . Drug Use: No  . Sexual Activity: No   Other Topics Concern  . Not on file   Social History Narrative  . No narrative on file    BP 112/67  Pulse 55 SpO2 96% Weight 120 lb (54.432 kg) Height 5\' 4"  (1.626 m)    PHYSICAL EXAM General: NAD, elderly, frail HEENT: Normal. Neck: No JVD, no thyromegaly. Lungs: Faint, intermittent left basilar crackles with normal respiratory effort. CV: Nondisplaced PMI. Regular rate and rhythm, normal S1/S2, +S3, no S4, no murmur. 1-2+pretibial and periankle edema.  Abdomen: Soft, nontender, no hepatosplenomegaly, no distention.  Neurologic: Alert and  oriented x 3.  Psych: Normal affect. Skin: Normal. Musculoskeletal: Normal range of motion, no gross deformities. Ambulates with cane. Extremities: No clubbing or cyanosis.   ECG: Most recent ECG reviewed.    ASSESSMENT AND PLAN: 1. Acute on chronic systolic heart failure: Appreciable leg swelling. I have instructed him to take Lasix 40 mg bid x next 4 days, and will check BMET in one week. Will prescribe knee-high compression stockings 20-30 mmHg. He had previously had problems with dehydration due to concomitant diarrhea and daily Lasix use. Continue ACEI and statin therapy and ASA 81 mg daily.   2. CKD stage III: BMET showed BUN 71, creatinine 1.95 on 3/18. Check BMET in one week given increased  diuretic requirement.  3. Hyperlipidemia: Continue simvastatin 40 mg daily.   Dispo: f/u 3 months.  Prentice DockerSuresh Destanie Tibbetts, M.D., F.A.C.C.

## 2014-09-09 NOTE — Patient Instructions (Addendum)
Your physician recommends that you schedule a follow-up appointment in: 3 months. Your physician has recommended you make the following change in your medication:  Increase furosemide 40 mg to twice daily for 4 days; then, resume previous daily dosing. Continue all other medications the same. Your physician recommends that you have lab work in 1 week around 09/16/14 to check your BMET. You have been given a prescription today for medium pressure compression stockings. 20-30 MM

## 2014-12-13 ENCOUNTER — Ambulatory Visit (INDEPENDENT_AMBULATORY_CARE_PROVIDER_SITE_OTHER): Payer: Medicare Other | Admitting: Cardiovascular Disease

## 2014-12-13 ENCOUNTER — Encounter: Payer: Self-pay | Admitting: Cardiovascular Disease

## 2014-12-13 VITALS — BP 100/58 | HR 61 | Ht 64.0 in | Wt 128.0 lb

## 2014-12-13 DIAGNOSIS — I509 Heart failure, unspecified: Secondary | ICD-10-CM | POA: Diagnosis not present

## 2014-12-13 DIAGNOSIS — N183 Chronic kidney disease, stage 3 unspecified: Secondary | ICD-10-CM

## 2014-12-13 DIAGNOSIS — M7989 Other specified soft tissue disorders: Secondary | ICD-10-CM | POA: Diagnosis not present

## 2014-12-13 DIAGNOSIS — I429 Cardiomyopathy, unspecified: Secondary | ICD-10-CM

## 2014-12-13 DIAGNOSIS — I519 Heart disease, unspecified: Secondary | ICD-10-CM

## 2014-12-13 DIAGNOSIS — I10 Essential (primary) hypertension: Secondary | ICD-10-CM

## 2014-12-13 DIAGNOSIS — E785 Hyperlipidemia, unspecified: Secondary | ICD-10-CM

## 2014-12-13 NOTE — Patient Instructions (Signed)
   Stop Lisinopril  Increase Lasix to 40mg  twice a day  X 3 days only, the back to 40mg  daily. Continue all other medications.   Lab for BMET - due in 5 days - around 12/20/2014. Office will contact with results via phone or letter.   Your physician wants you to follow up in:  3 months.  You will receive a reminder letter in the mail one-two months in advance.  If you don't receive a letter, please call our office to schedule the follow up appointment

## 2014-12-13 NOTE — Progress Notes (Signed)
Patient ID: Calvin Rivas, male   DOB: Aug 21, 1921, 81 y.o.   MRN: [id number]      SUBJECTIVE: The patient is a 81 year old male with severe left ventricular systolic dysfunction, EF 20-25%. He was hospitalized in 2015 for acute decompensated systolic heart failure with biventricular failure and diastolic dysfunction, for which I saw him in consultation. He has CKD stage III and group 2 pulmonary hypertension. He lives alone. His niece from [place], [place] (Murvin Natal) is here today at his office visit. She calls him once a week and visits every other month. He denies chest pain and shortness of breath. He has had some leg swelling but forgot to wear compression stockings. He recently lost his balance when his cane caught on a rug and fell.   ECG performed in the office today demonstrates sinus rhythm with old anteroseptal and high lateral infarct. There is a diffuse nonspecific T wave abnormality.  Review of Systems: As per "subjective", otherwise negative.  Allergies  Allergen Reactions  . Penicillins Rash and Other (See Comments)    Pt states dr told him if he took penicillin again "it could be the last time"  . Castor Oil     Pt. States he has seizures from ingesting it    Current Outpatient Prescriptions  Medication Sig Dispense Refill  . calcium carbonate (TUMS) 500 MG chewable tablet Chew 1 tablet by mouth daily.    Marland Kitchen CALCIUM PO Take 1 tablet by mouth daily.    [employer] Bandages & Supports (MEDICAL COMPRESSION STOCKINGS) MISC 1 each by Does not apply route as directed. 1 PAIR 20-30 MM OR MEDIUM PRESSURE DX: LEG EDEMA, CHF 1 each 0  . Fe Fum-FA-B Cmp-C-Zn-Mg-Mn-Cu (HEMATINIC PLUS COMPLEX) 106-1 MG TABS Take 1 tablet by mouth daily.    . furosemide (LASIX) 40 MG tablet Take 1 tablet (40 mg total) by mouth daily. 90 tablet 3  . lisinopril (PRINIVIL,ZESTRIL) 2.5 MG tablet Take 1 tablet (2.5 mg total) by mouth daily. 30 tablet 6  . metoprolol succinate (TOPROL-XL) 25 MG  24 hr tablet Take 1 tablet (25 mg total) by mouth daily.    . simvastatin (ZOCOR) 40 MG tablet Take 1 tablet (40 mg total) by mouth every evening. 30 tablet 0  . Vitamin D, Ergocalciferol, (DRISDOL) 50000 UNITS CAPS capsule Take 50,000 Units by mouth every 30 (thirty) days.      No current facility-administered medications for this visit.    Past Medical History  Diagnosis Date  . Hypertension   . Hypercholesterolemia   . Reflux     No past surgical history on file.  History   Social History  . Marital Status: Widowed    Spouse Name: N/A  . Number of Children: N/A  . Years of Education: N/A   Occupational History  . Not on file.   Social History Main Topics  . Smoking status: Never Smoker   . Smokeless tobacco: Never Used  . Alcohol Use: No  . Drug Use: No  . Sexual Activity: No   Other Topics Concern  . Not on file   Social History Narrative     Filed Vitals:   12/13/14 1316  BP: 100/58  Pulse: 61  Height: 5\' 4"  (1.626 m)  Weight: 128 lb (58.06 kg)  SpO2: 97%    PHYSICAL EXAM General: NAD, elderly, frail HEENT: Normal. Neck: No JVD, no thyromegaly. Lungs: Faint, intermittent left basilar crackles with normal respiratory effort (chronic). CV: Nondisplaced PMI. Regular rate and  rhythm, normal S1/S2, +S3, no S4, no murmur. 1+pretibial and periankle edema.  Abdomen: Soft, no distention.  Neurologic: Alert and oriented x 3.  Psych: Normal affect. Skin: Multiple ecchymotic lesions Musculoskeletal: Ambulates with cane. Extremities: No clubbing or cyanosis.   ECG: Most recent ECG reviewed.      ASSESSMENT AND PLAN: 1. Chronic systolic heart failure: Legs are swollen again. I will increase Lasix to 40 mg bid x 3 days and check a BMET in 5 days. I previously prescribed knee-high compression stockings 20-30 mmHg, but he has forgotten to wear them. He had previously had problems with dehydration due to concomitant diarrhea and daily Lasix use. Given low  BP I will d/c ACEI. Continue statin therapy and ASA 81 mg daily.   2. CKD stage III: Check BMET in 5 days given increased diuretic requirement.  3. Hyperlipidemia: Continue simvastatin 40 mg daily.   Dispo: f/u 3 months.   Prentice Docker, M.D., F.A.C.C.

## 2015-01-04 ENCOUNTER — Encounter: Payer: Self-pay | Admitting: *Deleted

## 2015-03-11 ENCOUNTER — Encounter: Payer: Self-pay | Admitting: *Deleted

## 2015-03-15 ENCOUNTER — Ambulatory Visit: Payer: Medicare Other | Admitting: Cardiovascular Disease

## 2015-04-04 ENCOUNTER — Inpatient Hospital Stay (HOSPITAL_COMMUNITY)
Admission: EM | Admit: 2015-04-04 | Discharge: 2015-04-14 | DRG: 682 | Disposition: A | Discharge status: Skilled Nursing Facility | Payer: Medicare Other | Attending: Internal Medicine | Admitting: Internal Medicine

## 2015-04-04 ENCOUNTER — Emergency Department (HOSPITAL_COMMUNITY): Payer: Medicare Other

## 2015-04-04 ENCOUNTER — Encounter (HOSPITAL_COMMUNITY): Payer: Self-pay | Admitting: *Deleted

## 2015-04-04 DIAGNOSIS — I1310 Hypertensive heart and chronic kidney disease without heart failure, with stage 1 through stage 4 chronic kidney disease, or unspecified chronic kidney disease: Secondary | ICD-10-CM

## 2015-04-04 DIAGNOSIS — R531 Weakness: Secondary | ICD-10-CM

## 2015-04-04 DIAGNOSIS — I5021 Acute systolic (congestive) heart failure: Secondary | ICD-10-CM

## 2015-04-04 DIAGNOSIS — I130 Hypertensive heart and chronic kidney disease with heart failure and stage 1 through stage 4 chronic kidney disease, or unspecified chronic kidney disease: Secondary | ICD-10-CM | POA: Diagnosis present

## 2015-04-04 DIAGNOSIS — D696 Thrombocytopenia, unspecified: Secondary | ICD-10-CM | POA: Diagnosis present

## 2015-04-04 DIAGNOSIS — Z9181 History of falling: Secondary | ICD-10-CM

## 2015-04-04 DIAGNOSIS — E785 Hyperlipidemia, unspecified: Secondary | ICD-10-CM | POA: Diagnosis present

## 2015-04-04 DIAGNOSIS — I951 Orthostatic hypotension: Secondary | ICD-10-CM | POA: Diagnosis present

## 2015-04-04 DIAGNOSIS — I9589 Other hypotension: Secondary | ICD-10-CM | POA: Insufficient documentation

## 2015-04-04 DIAGNOSIS — I5043 Acute on chronic combined systolic (congestive) and diastolic (congestive) heart failure: Secondary | ICD-10-CM | POA: Diagnosis present

## 2015-04-04 DIAGNOSIS — Z7189 Other specified counseling: Secondary | ICD-10-CM | POA: Insufficient documentation

## 2015-04-04 DIAGNOSIS — I872 Venous insufficiency (chronic) (peripheral): Secondary | ICD-10-CM | POA: Diagnosis present

## 2015-04-04 DIAGNOSIS — R001 Bradycardia, unspecified: Secondary | ICD-10-CM

## 2015-04-04 DIAGNOSIS — I255 Ischemic cardiomyopathy: Secondary | ICD-10-CM | POA: Diagnosis present

## 2015-04-04 DIAGNOSIS — Z515 Encounter for palliative care: Secondary | ICD-10-CM | POA: Diagnosis not present

## 2015-04-04 DIAGNOSIS — K219 Gastro-esophageal reflux disease without esophagitis: Secondary | ICD-10-CM | POA: Diagnosis present

## 2015-04-04 DIAGNOSIS — I5023 Acute on chronic systolic (congestive) heart failure: Secondary | ICD-10-CM | POA: Insufficient documentation

## 2015-04-04 DIAGNOSIS — T447X5A Adverse effect of beta-adrenoreceptor antagonists, initial encounter: Secondary | ICD-10-CM | POA: Diagnosis present

## 2015-04-04 DIAGNOSIS — W19XXXD Unspecified fall, subsequent encounter: Secondary | ICD-10-CM | POA: Diagnosis not present

## 2015-04-04 DIAGNOSIS — R778 Other specified abnormalities of plasma proteins: Secondary | ICD-10-CM

## 2015-04-04 DIAGNOSIS — N179 Acute kidney failure, unspecified: Secondary | ICD-10-CM | POA: Diagnosis present

## 2015-04-04 DIAGNOSIS — R296 Repeated falls: Secondary | ICD-10-CM

## 2015-04-04 DIAGNOSIS — R55 Syncope and collapse: Secondary | ICD-10-CM | POA: Diagnosis not present

## 2015-04-04 DIAGNOSIS — T796XXA Traumatic ischemia of muscle, initial encounter: Secondary | ICD-10-CM

## 2015-04-04 DIAGNOSIS — W19XXXA Unspecified fall, initial encounter: Secondary | ICD-10-CM | POA: Diagnosis not present

## 2015-04-04 DIAGNOSIS — D649 Anemia, unspecified: Secondary | ICD-10-CM | POA: Diagnosis present

## 2015-04-04 DIAGNOSIS — R9431 Abnormal electrocardiogram [ECG] [EKG]: Secondary | ICD-10-CM

## 2015-04-04 DIAGNOSIS — I248 Other forms of acute ischemic heart disease: Secondary | ICD-10-CM | POA: Diagnosis present

## 2015-04-04 DIAGNOSIS — E780 Pure hypercholesterolemia: Secondary | ICD-10-CM | POA: Diagnosis present

## 2015-04-04 DIAGNOSIS — R5381 Other malaise: Secondary | ICD-10-CM

## 2015-04-04 DIAGNOSIS — E440 Moderate protein-calorie malnutrition: Secondary | ICD-10-CM | POA: Diagnosis present

## 2015-04-04 DIAGNOSIS — M6282 Rhabdomyolysis: Secondary | ICD-10-CM

## 2015-04-04 DIAGNOSIS — I519 Heart disease, unspecified: Secondary | ICD-10-CM | POA: Insufficient documentation

## 2015-04-04 DIAGNOSIS — N183 Chronic kidney disease, stage 3 (moderate): Secondary | ICD-10-CM | POA: Diagnosis present

## 2015-04-04 DIAGNOSIS — I429 Cardiomyopathy, unspecified: Secondary | ICD-10-CM | POA: Diagnosis not present

## 2015-04-04 DIAGNOSIS — Z681 Body mass index (BMI) 19 or less, adult: Secondary | ICD-10-CM

## 2015-04-04 DIAGNOSIS — N170 Acute kidney failure with tubular necrosis: Secondary | ICD-10-CM

## 2015-04-04 DIAGNOSIS — Z66 Do not resuscitate: Secondary | ICD-10-CM | POA: Diagnosis present

## 2015-04-04 DIAGNOSIS — I509 Heart failure, unspecified: Secondary | ICD-10-CM

## 2015-04-04 DIAGNOSIS — R7989 Other specified abnormal findings of blood chemistry: Secondary | ICD-10-CM

## 2015-04-04 DIAGNOSIS — F0390 Unspecified dementia without behavioral disturbance: Secondary | ICD-10-CM | POA: Diagnosis present

## 2015-04-04 HISTORY — DX: Personal history of (healed) traumatic fracture: Z87.81

## 2015-04-04 HISTORY — DX: Essential (primary) hypertension: I10

## 2015-04-04 HISTORY — DX: Chronic systolic (congestive) heart failure: I50.22

## 2015-04-04 HISTORY — DX: Cardiomyopathy, unspecified: I42.9

## 2015-04-04 HISTORY — DX: Gastro-esophageal reflux disease without esophagitis: K21.9

## 2015-04-04 LAB — CBC WITH DIFFERENTIAL/PLATELET
BASOS PCT: 0 % (ref 0–1)
Basophils Absolute: 0 10*3/uL (ref 0.0–0.1)
Eosinophils Absolute: 0.1 10*3/uL (ref 0.0–0.7)
Eosinophils Relative: 1 % (ref 0–5)
HEMATOCRIT: 37.7 % — AB (ref 39.0–52.0)
HEMOGLOBIN: 12.4 g/dL — AB (ref 13.0–17.0)
LYMPHS PCT: 10 % — AB (ref 12–46)
Lymphs Abs: 0.7 10*3/uL (ref 0.7–4.0)
MCH: 33.4 pg (ref 26.0–34.0)
MCHC: 32.9 g/dL (ref 30.0–36.0)
MCV: 101.6 fL — ABNORMAL HIGH (ref 78.0–100.0)
MONO ABS: 0.7 10*3/uL (ref 0.1–1.0)
Monocytes Relative: 9 % (ref 3–12)
NEUTROS ABS: 5.8 10*3/uL (ref 1.7–7.7)
Neutrophils Relative %: 80 % — ABNORMAL HIGH (ref 43–77)
Platelets: 64 10*3/uL — ABNORMAL LOW (ref 150–400)
RBC: 3.71 MIL/uL — ABNORMAL LOW (ref 4.22–5.81)
RDW: 15.9 % — ABNORMAL HIGH (ref 11.5–15.5)
WBC: 7.3 10*3/uL (ref 4.0–10.5)

## 2015-04-04 LAB — TROPONIN I
TROPONIN I: 0.31 ng/mL — AB (ref <0.031)
TROPONIN I: 0.31 ng/mL — AB (ref <0.031)

## 2015-04-04 LAB — URINALYSIS, ROUTINE W REFLEX MICROSCOPIC
Glucose, UA: NEGATIVE mg/dL
Ketones, ur: NEGATIVE mg/dL
Nitrite: NEGATIVE
PH: 5.5 (ref 5.0–8.0)
Protein, ur: 100 mg/dL — AB
UROBILINOGEN UA: 0.2 mg/dL (ref 0.0–1.0)

## 2015-04-04 LAB — BASIC METABOLIC PANEL
Anion gap: 10 (ref 5–15)
BUN: 87 mg/dL — ABNORMAL HIGH (ref 6–20)
CO2: 26 mmol/L (ref 22–32)
Calcium: 8.7 mg/dL — ABNORMAL LOW (ref 8.9–10.3)
Chloride: 101 mmol/L (ref 101–111)
Creatinine, Ser: 2.93 mg/dL — ABNORMAL HIGH (ref 0.61–1.24)
GFR, EST AFRICAN AMERICAN: 20 mL/min — AB (ref >60)
GFR, EST NON AFRICAN AMERICAN: 17 mL/min — AB (ref >60)
Glucose, Bld: 126 mg/dL — ABNORMAL HIGH (ref 65–99)
POTASSIUM: 4 mmol/L (ref 3.5–5.1)
Sodium: 137 mmol/L (ref 135–145)

## 2015-04-04 LAB — URINE MICROSCOPIC-ADD ON

## 2015-04-04 LAB — CK: Total CK: 1525 U/L — ABNORMAL HIGH (ref 49–397)

## 2015-04-04 LAB — LACTIC ACID, PLASMA
LACTIC ACID, VENOUS: 2.9 mmol/L — AB (ref 0.5–2.0)
Lactic Acid, Venous: 3.4 mmol/L (ref 0.5–2.0)

## 2015-04-04 LAB — BRAIN NATRIURETIC PEPTIDE: B NATRIURETIC PEPTIDE 5: 2864 pg/mL — AB (ref 0.0–100.0)

## 2015-04-04 MED ORDER — ONDANSETRON HCL 4 MG PO TABS
4.0000 mg | ORAL_TABLET | Freq: Four times a day (QID) | ORAL | Status: DC | PRN
  Administered 2015-04-08: 4 mg via ORAL
  Filled 2015-04-04: qty 1

## 2015-04-04 MED ORDER — HEPARIN SODIUM (PORCINE) 5000 UNIT/ML IJ SOLN
5000.0000 [IU] | Freq: Three times a day (TID) | INTRAMUSCULAR | Status: DC
  Administered 2015-04-04 – 2015-04-13 (×21): 5000 [IU] via SUBCUTANEOUS
  Filled 2015-04-04 (×23): qty 1

## 2015-04-04 MED ORDER — ACETAMINOPHEN 325 MG PO TABS
650.0000 mg | ORAL_TABLET | Freq: Four times a day (QID) | ORAL | Status: DC | PRN
  Administered 2015-04-13: 650 mg via ORAL
  Filled 2015-04-04: qty 2

## 2015-04-04 MED ORDER — SODIUM CHLORIDE 0.9 % IV SOLN
INTRAVENOUS | Status: AC
  Administered 2015-04-04: 21:00:00 via INTRAVENOUS

## 2015-04-04 MED ORDER — SODIUM CHLORIDE 0.9 % IV SOLN
INTRAVENOUS | Status: DC
  Administered 2015-04-05: 12:00:00 via INTRAVENOUS

## 2015-04-04 MED ORDER — ENSURE ENLIVE PO LIQD
237.0000 mL | Freq: Two times a day (BID) | ORAL | Status: DC
  Administered 2015-04-05 – 2015-04-14 (×16): 237 mL via ORAL

## 2015-04-04 MED ORDER — SIMVASTATIN 20 MG PO TABS
40.0000 mg | ORAL_TABLET | Freq: Every evening | ORAL | Status: DC
  Administered 2015-04-04: 40 mg via ORAL
  Filled 2015-04-04: qty 4

## 2015-04-04 MED ORDER — ASPIRIN 81 MG PO CHEW
324.0000 mg | CHEWABLE_TABLET | Freq: Once | ORAL | Status: AC
  Administered 2015-04-04: 324 mg via ORAL
  Filled 2015-04-04: qty 4

## 2015-04-04 MED ORDER — SODIUM CHLORIDE 0.9 % IV BOLUS (SEPSIS)
500.0000 mL | Freq: Once | INTRAVENOUS | Status: AC
  Administered 2015-04-04 (×2): 500 mL via INTRAVENOUS

## 2015-04-04 MED ORDER — LISINOPRIL 5 MG PO TABS
2.5000 mg | ORAL_TABLET | Freq: Every day | ORAL | Status: DC
Start: 2015-04-04 — End: 2015-04-05

## 2015-04-04 MED ORDER — SODIUM CHLORIDE 0.9 % IV SOLN
INTRAVENOUS | Status: DC
  Administered 2015-04-05 – 2015-04-06 (×2): via INTRAVENOUS

## 2015-04-04 MED ORDER — SODIUM CHLORIDE 0.9 % IJ SOLN
3.0000 mL | Freq: Two times a day (BID) | INTRAMUSCULAR | Status: DC
  Administered 2015-04-05 – 2015-04-13 (×15): 3 mL via INTRAVENOUS

## 2015-04-04 MED ORDER — ACETAMINOPHEN 650 MG RE SUPP: 650.0000 mg | Freq: Four times a day (QID) | RECTAL | Status: DC | PRN

## 2015-04-04 MED ORDER — ONDANSETRON HCL 4 MG/2ML IJ SOLN: 4.0000 mg | Freq: Four times a day (QID) | INTRAMUSCULAR | Status: DC | PRN

## 2015-04-04 MED ORDER — OXYCODONE HCL 5 MG PO TABS: 5.0000 mg | ORAL_TABLET | ORAL | Status: DC | PRN

## 2015-04-04 NOTE — ED Notes (Signed)
Pt provided urine sample w/o need of in and out cath.

## 2015-04-04 NOTE — ED Notes (Signed)
Dr. Clarene Duke notified of Lactic of 3.4.

## 2015-04-04 NOTE — ED Notes (Addendum)
Patient fell from w/c onto buttocks on floor at church around 1800 on Saturday. Was found Sunday morning around 0930 when church members came into church..  Denies pain, but reports he has been weak lately.

## 2015-04-04 NOTE — ED Notes (Signed)
CRITICAL VALUE ALERT  Critical value received:  Lactic acid 3.4  Date of notification:  04/04/2015  Time of notification:  1747  Critical value read back: Yes  Nurse who received alert: Laury Deep, RN

## 2015-04-04 NOTE — H&P (Addendum)
[facility] History and Physical  Calvin Rivas:[id number] DOB: July 17, 1921 DOA: 04/04/2015  Referring physician: Dr. Clarene Duke - [facility] PCP: Glori Bickers, MD   Chief Complaint: Weakness  HPI: Calvin Rivas is a 81 y.o. male  Gradual onset general weakness with falls. Weakness is constant and getting worse. Patient reports numerous falls over the last 2 weeks with the most significant occurring Saturday night. Patient states he was cleaning a church and fell and was unable to get up until the following morning when he was assisted by [employer]. Patient denies striking his head or loss of consciousness or symptoms of dizziness and describes the falls as simply losing balance. Patient lives by himself. Patient is accompanied today by his niece, who came down from [place] to bring patient to the hospital. Of note patient had an right sided Unna boot in place which was removed in the ED. This was for chronic venous stasis wounds of the right lower 70. Denies chest pain shortness of breath, nausea, vomiting, abdominal pain, LOC, diarrhea, constipation, dysuria, frequency, back pain from the neck stiffness, headache, loss of appetite, cough, congestion, dysphagia, melena, hematochezia, hemoptysis, palpitations.   Review of Systems:  Per history of present illness with all other systems negative. Review of systems very difficult to obtain due to patient's speech being very difficult to understand and tangential thought process which is likely due to his age area family members endorse that this is his normal mental status.  Past Medical History  Diagnosis Date  . Hypertension   . Hypercholesterolemia   . Reflux   . Arm fracture     as child  . Leg fracture   . CHF (congestive heart failure)   . H/O echocardiogram 2015    EF 20%   Past Surgical History  Procedure Laterality Date  . No past surgeries     Social History:  reports that he has never smoked. He has never  used smokeless tobacco. He reports that he does not drink alcohol or use illicit drugs.  Allergies  Allergen Reactions  . Penicillins Rash and Other (See Comments)    Pt states dr told him if he took penicillin again "it could be the last time"  . Castor Oil     Pt. States he has seizures from ingesting it    Family History  Problem Relation Age of Onset  . Family history unknown: Yes     Prior to Admission medications   Medication Sig Start Date End Date Taking? Authorizing Provider  CALCIUM PO Take 1 tablet by mouth daily.   Yes Historical Provider, MD  clarithromycin (BIAXIN) 250 MG tablet Take 250 mg by mouth 2 (two) times daily.   Yes Historical Provider, MD  furosemide (LASIX) 40 MG tablet Take 1 tablet (40 mg total) by mouth daily. 09/13/14  Yes Laqueta Linden, MD  lisinopril (PRINIVIL,ZESTRIL) 2.5 MG tablet Take 2.5 mg by mouth daily. 03/01/15  Yes Historical Provider, MD  metoprolol succinate (TOPROL-XL) 25 MG 24 hr tablet Take 1 tablet (25 mg total) by mouth daily. 06/10/14  Yes Laqueta Linden, MD  simvastatin (ZOCOR) 40 MG tablet Take 1 tablet (40 mg total) by mouth every evening. 08/28/13  Yes Freeman Caldron, PA-C  Vitamin D, Ergocalciferol, (DRISDOL) 50000 UNITS CAPS capsule Take 50,000 Units by mouth every 30 (thirty) days.    Yes Historical Provider, MD  Elastic Bandages & Supports (MEDICAL COMPRESSION STOCKINGS) MISC 1 each by Does not apply route as directed.  1 PAIR 20-30 MM OR MEDIUM PRESSURE DX: LEG EDEMA, CHF Patient not taking: Reported on 04/04/2015 09/09/14   Laqueta Linden, MD   Physical Exam: Filed Vitals:   04/04/15 1800 04/04/15 1830 04/04/15 1900 04/04/15 1930  BP:  92/47 102/55 95/50  Pulse: 45 45 47 48  Temp:      TempSrc:      Resp: Height:      Weight:      SpO2: 90% 90% 92% 91%    Wt Readings from Last 3 Encounters:  04/04/15 58.968 kg (130 lb)  12/13/14 58.06 kg (128 lb)  09/09/14 54.432 kg (120 lb)    General:   Appears calm and comfortable Eyes:  PERRL, normal lids, irises & conjunctiva ENT: Dry mucous membranes Neck:  no LAD, masses or thyromegaly Cardiovascular:  RRR, faint heart sounds, 2/6 systolic murmur . 1+ LE edema. Telemetry:  SR, no arrhythmias  Respiratory:  CTA bilaterally, no w/r/r. Normal respiratory effort. Abdomen:  soft, ntnd Skin:  no rash or induration seen on limited exam Musculoskeletal:  Global upper and lower extremity weakness.  Psychiatric:  grossly normal mood and affect, speech fluent and appropriate Neurologic:  grossly non-focal.          Labs on Admission:  Basic Metabolic Panel:  Recent Labs Lab 04/04/15 1632  NA 137  K 4.0  CL 101  CO2 26  GLUCOSE 126*  BUN 87*  CREATININE 2.93*  CALCIUM 8.7*   Liver Function Tests: No results for input(s): AST, ALT, ALKPHOS, BILITOT, PROT, ALBUMIN in the last 168 hours. No results for input(s): LIPASE, AMYLASE in the last 168 hours. No results for input(s): AMMONIA in the last 168 hours. CBC:  Recent Labs Lab 04/04/15 1632  WBC 7.3  NEUTROABS 5.8  HGB 12.4*  HCT 37.7*  MCV 101.6*  PLT 64*   Cardiac Enzymes:  Recent Labs Lab 04/04/15 1632  CKTOTAL 1525*  TROPONINI 0.31*    BNP (last 3 results)  Recent Labs  04/04/15 1632  BNP 2864.0*    ProBNP (last 3 results) No results for input(s): PROBNP in the last 8760 hours.  CBG: No results for input(s): GLUCAP in the last 168 hours.  Radiological Exams on Admission: Dg Chest 1 View  04/04/2015   CLINICAL DATA:  Larey Seat from wheelchair at church 2 days ago. Initial encounter.  EXAM: CHEST  1 VIEW  COMPARISON:  November 26, 2013.  FINDINGS: Stable cardiomegaly. No pneumothorax is noted. Increased interstitial densities are noted throughout both lungs concerning for vascular congestion and pulmonary edema. Probable left pleural effusion is noted. Bony thorax is intact. Multilevel degenerative disc disease is noted in thoracic spine.  IMPRESSION:  Cardiomegaly is noted with increased interstitial densities throughout both lungs concerning for pulmonary edema and possible congestive heart failure. Mild left pleural effusion is noted which appears to be increased compared to prior exam.   Electronically Signed   By: Lupita Raider, M.D.   On: 04/04/2015 17:19   Ct Head Wo Contrast  04/04/2015   CLINICAL DATA:  Weakness after fall from wheelchair 2 days ago.  EXAM: CT HEAD WITHOUT CONTRAST  TECHNIQUE: Contiguous axial images were obtained from the base of the skull through the vertex without intravenous contrast.  COMPARISON:  CT scan of August 24, 2013.  FINDINGS: Bony calvarium appears intact. Mild diffuse cortical atrophy is noted. Mild chronic ischemic white matter disease is noted. No mass effect or midline shift is noted.  Ventricular size is within normal limits. There is no evidence of mass lesion, hemorrhage or acute infarction. 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  IMPRESSION: Mild diffuse cortical atrophy. Mild chronic ischemic white matter disease. No acute intracranial abnormality seen.   Electronically Signed   By: Lupita Raider, M.D.   On: 04/04/2015 17:34   Dg Hips Bilat With Pelvis 2v  04/04/2015   CLINICAL DATA:  Status post fall striking the buttocks on Saturday ; patient not found until Sunday ; patient reports no pain but is week  EXAM: BILATERAL HIP (WITH PELVIS) 2 VIEWS  COMPARISON:  None.  FINDINGS: The bony pelvis is osteopenic. There is deformity of the inferior pubic ramus on the left that is of uncertain age. The observed portions of the sacrum exhibit no acute abnormalities. The SI joints are unremarkable. There is symmetric narrowing of the hip joint spaces bilaterally. The femoral heads, necks, and intertrochanteric regions are normal.  IMPRESSION: Deformity of the inferior pubic ramus on the left that is of uncertain age. No acute fracture is demonstrated elsewhere. There is degenerative narrowing of the  hip joint spaces bilaterally.  If there are strong clinical concerns that the left inferior pubic ramus abnormality is acute or that there is occult pathology elsewhere, pelvic MRI or CT scanning would be useful.   Electronically Signed   By: Iyannah Blake  [place] M.D.   On: 04/04/2015 17:22    EKG: Independently reviewed. A. fib, bradycardic. No sign of ACS.  Assessment/Plan Active Problems:   Acute renal failure   Cardiovascular renal disease   AKI (acute kidney injury)   Rhabdomyolysis   Physical deconditioning   Acute systolic congestive heart failure   HLD (hyperlipidemia)   Venous stasis dermatitis   Falls   Generalized weakness   Bradycardia   Acute kidney injury: Creatinine 2.93. 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 - Telemetry - Gentle IVF (given CHF) - NS 39ml/hr - encouraged oral fluid intake - BMET in am - repeat Lactic acid  Generalized weakness: Likely multifactorial including progressive heart failure, likely rhabdomyolysis, malnutrition, and age-related physical deconditioning. - PT/OT - Ensure TID  - Nutrition consult  Acute systolic CHF: EF 65-78%. BNP 2864. Troponin 0.31. Cardiologist Dr. [name]. Chest x-ray concerning for likely early CHF. Fortunately patient seems to be well compensated from a respiratory standpoint - Hold lasix until the am and able to reevaluate pt (pt very brady, hypotensive and in renal failure) - Cycle troponins (initial + at 0.31) - Consult Dr. [name]  Inferior Pubic Rami fracture: Unlikely acute given absence of pain.  - monitor  Bradycardia, hypotension: Likely due to CHF as well as use of beta blocker and diuretics. Patient lives at home as there is concern that he may be taking his medications incorrectly. - Hold metoprolol - Telemetry  HLD: -  continue statin  Venous Stasis dermatitis - Replace Unaboot   Code Status: FULL DVT Prophylaxis: Hep Family Communication: Niece Disposition Plan: Pending improvement   Derell Bruun Shela Commons, MD Family Medicine [facility] [email or web address] Password [email or web address]

## 2015-04-04 NOTE — ED Notes (Signed)
CRITICAL VALUE ALERT  Critical value received:  Lactic Acid 2.9  Date of notification:  04/04/15  Time of notification:  1950  Critical value read back:Yes.    Nurse who received alert:  Y. Katrinka Herbison, RN  MD notified (1st page):  Dr. Clarene Duke  Time of first page:  1950  Responding MD:  Dr. Clarene Duke  Time MD responded:  [phone]

## 2015-04-04 NOTE — ED Notes (Signed)
(  Niece) Murvin Natal [phone], emergency contact.

## 2015-04-04 NOTE — ED Provider Notes (Signed)
CSN: [id number]     Arrival date & time 04/04/15  1539 History   First MD Initiated Contact with Patient 04/04/15 1558     Chief Complaint  Patient presents with  . Fall  . Weakness      HPI  Pt was seen at 1610. Per pt and friend, c/o gradual onset and worsening of generalized weakness/fatigue for the past 2 weeks. Has been associated with frequent falling. Pt fell 2 days ago (Saturday) and was found the next morning sitting on the floor. Pt states he was unable to get himself back up due to generalized weakness. Pt fell again this morning and was found sitting on the floor by a neighbor who checked in on him. Pt stated again he was unable to get up "because I was too weak." Pt denies any other complaints. Pt denies syncope, no CP/palpitations, no SOB/cough, no abd pain, no N/V/D, no head injury, no neck or back pain, no UE's or LE's pain, no fevers.    Past Medical History  Diagnosis Date  . Hypertension   . Hypercholesterolemia   . Reflux   . Arm fracture     as child  . Leg fracture   . CHF (congestive heart failure)   . H/O echocardiogram 2015    EF 20%   Past Surgical History  Procedure Laterality Date  . No past surgeries      History  Substance Use Topics  . Smoking status: Never Smoker   . Smokeless tobacco: Never Used  . Alcohol Use: No    Review of Systems ROS: Statement: All systems negative except as marked or noted in the HPI; Constitutional: Negative for fever and chills. +generalized weakness/fatigue; ; Eyes: Negative for eye pain, redness and discharge. ; ; ENMT: Negative for ear pain, hoarseness, nasal congestion, sinus pressure and sore throat. ; ; Cardiovascular: Negative for chest pain, palpitations, diaphoresis, dyspnea and peripheral edema. ; ; Respiratory: Negative for cough, wheezing and stridor. ; ; Gastrointestinal: Negative for nausea, vomiting, diarrhea, abdominal pain, blood in stool, hematemesis, jaundice and rectal bleeding. . ; ; Genitourinary:  Negative for dysuria, flank pain and hematuria. ; ; Musculoskeletal: Negative for back pain and neck pain. Negative for swelling and trauma.; ; Skin: Negative for pruritus, rash, abrasions, blisters, bruising and skin lesion.; ; Neuro: Negative for headache, lightheadedness and neck stiffness. Negative for altered level of consciousness , altered mental status, extremity weakness, paresthesias, involuntary movement, seizure and syncope.     Allergies  Penicillins and Castor oil  Home Medications   Prior to Admission medications   Medication Sig Start Date End Date Taking? Authorizing Provider  CALCIUM PO Take 1 tablet by mouth daily.   Yes Historical Provider, MD  clarithromycin (BIAXIN) 250 MG tablet Take 250 mg by mouth 2 (two) times daily.   Yes Historical Provider, MD  furosemide (LASIX) 40 MG tablet Take 1 tablet (40 mg total) by mouth daily. 09/13/14  Yes Laqueta Linden, MD  lisinopril (PRINIVIL,ZESTRIL) 2.5 MG tablet Take 2.5 mg by mouth daily. 03/01/15  Yes Historical Provider, MD  metoprolol succinate (TOPROL-XL) 25 MG 24 hr tablet Take 1 tablet (25 mg total) by mouth daily. 06/10/14  Yes Laqueta Linden, MD  simvastatin (ZOCOR) 40 MG tablet Take 1 tablet (40 mg total) by mouth every evening. 08/28/13  Yes Freeman Caldron, PA-C  Vitamin D, Ergocalciferol, (DRISDOL) 50000 UNITS CAPS capsule Take 50,000 Units by mouth every 30 (thirty) days.    Yes Historical  Provider, MD  Elastic Bandages & Supports (MEDICAL COMPRESSION STOCKINGS) MISC 1 each by Does not apply route as directed. 1 PAIR 20-30 MM OR MEDIUM PRESSURE DX: LEG EDEMA, CHF Patient not taking: Reported on 04/04/2015 09/09/14   Laqueta Linden, MD   BP 92/47 mmHg  Pulse 45  Temp(Src) 98 F (36.7 C) (Oral)  Resp 12  Ht 5\' 5"  (1.651 m)  Wt 130 lb (58.968 kg)  BMI 21.63 kg/m2  SpO2 90%   Filed Vitals:   04/04/15 1800 04/04/15 1830 04/04/15 1900 04/04/15 1930  BP:  92/47 102/55 95/50  Pulse: 45 45 47 48  Temp:       TempSrc:      Resp: 15 12 11 15   Height:      Weight:      SpO2: 90% 90% 92% 91%     16:18 Orthostatic Vital Signs FG  Orthostatic Lying  - BP- Lying: 95/57 mmHg ; Pulse- Lying: 47  Orthostatic Sitting - BP- Sitting: 87/52 mmHg ; Pulse- Sitting: 47  Orthostatic Standing at 0 minutes - BP- Standing at 0 minutes:  (pt is unable to stand )      Physical Exam  1615: Physical examination:  Nursing notes reviewed; Vital signs and O2 SAT reviewed;  Constitutional: Well developed, Well nourished, In no acute distress; Head:  Normocephalic, atraumatic; Eyes: EOMI, PERRL, No scleral icterus; ENMT: Mouth and pharynx normal, Mucous membranes dry and cracked; Neck: Supple, Full range of motion, No lymphadenopathy; Cardiovascular: Bradycardic rate and irregular rhythm, No gallop; Respiratory: Breath sounds clear & equal bilaterally, No rales, rhonchi, wheezes.  Speaking full sentences with ease, Normal respiratory effort/excursion; Chest: Nontender, Movement normal; Abdomen: Soft, Nontender, Nondistended, Normal bowel sounds; Genitourinary: No CVA tenderness; Spine:  No midline CS, TS, LS tenderness.;; Extremities: Pulses normal, Pelvis stable. No tenderness, +tight ACE wrap RLE, +2 bilat pedal edema.; Neuro: AA&Ox3, Major CN grossly intact.  Speech clear. No gross focal motor or sensory deficits in extremities.; Skin: Color normal, Warm, Dry.   ED Course  Procedures      EKG Interpretation   Date/Time:  Monday April 04 2015 16:22:40 EDT Ventricular Rate:  47 PR Interval:    QRS Duration: 101 QT Interval:  582 QTC Calculation: 515 R Axis:   -128 Text Interpretation:  Atrial fibrillation Rightward axis Low voltage,  extremity and precordial leads Probable anteroseptal infarct, old  Nonspecific T abnormalities, lateral leads Prolonged QT interval When  compared with ECG of 11/29/2013 QT has lengthened and Atrial fibrillation  is now Present Confirmed by [facility]  MD, Nicholos Johns [phone]) on  04/04/2015  4:48:17 PM      MDM  MDM Reviewed: previous chart, nursing note and vitals Reviewed previous: labs and ECG Interpretation: labs, ECG and x-ray Total time providing critical care: 30-74 minutes. This excludes time spent performing separately reportable procedures and services. Consults: admitting MD    Results for orders placed or performed during the hospital encounter of 04/04/15  Urinalysis, Routine w reflex microscopic (not at [facility])  Result Value Ref Range   Color, Urine YELLOW YELLOW   APPearance HAZY (A) CLEAR   Specific Gravity, Urine >1.030 (H) 1.005 - 1.030   pH 5.5 5.0 - 8.0   Glucose, UA NEGATIVE NEGATIVE mg/dL   Hgb urine dipstick SMALL (A) NEGATIVE   Bilirubin Urine SMALL (A) NEGATIVE   Ketones, ur NEGATIVE NEGATIVE mg/dL   Protein, ur 962 (A) NEGATIVE mg/dL   Urobilinogen, UA 0.2 0.0 - 1.0 mg/dL  Nitrite NEGATIVE NEGATIVE   Leukocytes, UA TRACE (A) NEGATIVE  CK  Result Value Ref Range   Total CK 1525 (H) 49 - 397 U/L  Basic metabolic panel  Result Value Ref Range   Sodium 137 135 - 145 mmol/L   Potassium 4.0 3.5 - 5.1 mmol/L   Chloride 101 101 - 111 mmol/L   CO2 26 22 - 32 mmol/L   Glucose, Bld 126 (H) 65 - 99 mg/dL   BUN 87 (H) 6 - 20 mg/dL   Creatinine, Ser 4.54 (H) 0.61 - 1.24 mg/dL   Calcium 8.7 (L) 8.9 - 10.3 mg/dL   GFR calc non Af Amer 17 (L) >60 mL/min   GFR calc Af Amer 20 (L) >60 mL/min   Anion gap 10 5 - 15  Troponin I  Result Value Ref Range   Troponin I 0.31 (H) <0.031 ng/mL  Lactic acid, plasma  Result Value Ref Range   Lactic Acid, Venous 3.4 (HH) 0.5 - 2.0 mmol/L  Lactic acid, plasma  Result Value Ref Range   Lactic Acid, Venous 2.9 (HH) 0.5 - 2.0 mmol/L  CBC with Differential  Result Value Ref Range   WBC 7.3 4.0 - 10.5 K/uL   RBC 3.71 (L) 4.22 - 5.81 MIL/uL   Hemoglobin 12.4 (L) 13.0 - 17.0 g/dL   HCT 09.8 (L) 11.9 - 14.7 %   MCV 101.6 (H) 78.0 - 100.0 fL   MCH 33.4 26.0 - 34.0 pg   MCHC 32.9 30.0 - 36.0 g/dL    RDW 82.9 (H) 56.2 - 15.5 %   Platelets 64 (L) 150 - 400 K/uL   Neutrophils Relative % 80 (H) 43 - 77 %   Neutro Abs 5.8 1.7 - 7.7 K/uL   Lymphocytes Relative 10 (L) 12 - 46 %   Lymphs Abs 0.7 0.7 - 4.0 K/uL   Monocytes Relative 9 3 - 12 %   Monocytes Absolute 0.7 0.1 - 1.0 K/uL   Eosinophils Relative 1 0 - 5 %   Eosinophils Absolute 0.1 0.0 - 0.7 K/uL   Basophils Relative 0 0 - 1 %   Basophils Absolute 0.0 0.0 - 0.1 K/uL  Brain natriuretic peptide  Result Value Ref Range   B Natriuretic Peptide 2864.0 (H) 0.0 - 100.0 pg/mL  Urine microscopic-add on  Result Value Ref Range   Squamous Epithelial / LPF FEW (A) RARE   WBC, UA 21-50 <3 WBC/hpf   RBC / HPF 3-6 <3 RBC/hpf   Bacteria, UA MANY (A) RARE   Casts GRANULAR CAST (A) NEGATIVE   Dg Chest 1 View 04/04/2015   CLINICAL DATA:  Larey Seat from wheelchair at church 2 days ago. Initial encounter.  EXAM: CHEST  1 VIEW  COMPARISON:  November 26, 2013.  FINDINGS: Stable cardiomegaly. No pneumothorax is noted. Increased interstitial densities are noted throughout both lungs concerning for vascular congestion and pulmonary edema. Probable left pleural effusion is noted. Bony thorax is intact. Multilevel degenerative disc disease is noted in thoracic spine.  IMPRESSION: Cardiomegaly is noted with increased interstitial densities throughout both lungs concerning for pulmonary edema and possible congestive heart failure. Mild left pleural effusion is noted which appears to be increased compared to prior exam.   Electronically Signed   By: Lupita Raider, M.D.   On: 04/04/2015 17:19   Ct Head Wo Contrast 04/04/2015   CLINICAL DATA:  Weakness after fall from wheelchair 2 days ago.  EXAM: CT HEAD WITHOUT CONTRAST  TECHNIQUE: Contiguous axial images were  obtained from the base of the skull through the vertex without intravenous contrast.  COMPARISON:  CT scan of August 24, 2013.  FINDINGS: Bony calvarium appears intact. Mild diffuse cortical atrophy is noted.  Mild chronic ischemic white matter disease is noted. No mass effect or midline shift is noted. Ventricular size is within normal limits. There is no evidence of mass lesion, hemorrhage or acute infarction. 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  IMPRESSION: Mild diffuse cortical atrophy. Mild chronic ischemic white matter disease. No acute intracranial abnormality seen.   Electronically Signed   By: Lupita Raider, M.D.   On: 04/04/2015 17:34   Dg Hips Bilat With Pelvis 2v 04/04/2015   CLINICAL DATA:  Status post fall striking the buttocks on Saturday ; patient not found until Sunday ; patient reports no pain but is week  EXAM: BILATERAL HIP (WITH PELVIS) 2 VIEWS  COMPARISON:  None.  FINDINGS: The bony pelvis is osteopenic. There is deformity of the inferior pubic ramus on the left that is of uncertain age. The observed portions of the sacrum exhibit no acute abnormalities. The SI joints are unremarkable. There is symmetric narrowing of the hip joint spaces bilaterally. The femoral heads, necks, and intertrochanteric regions are normal.  IMPRESSION: Deformity of the inferior pubic ramus on the left that is of uncertain age. No acute fracture is demonstrated elsewhere. There is degenerative narrowing of the hip joint spaces bilaterally.  If there are strong clinical concerns that the left inferior pubic ramus abnormality is acute or that there is occult pathology elsewhere, pelvic MRI or CT scanning would be useful.   Electronically Signed   By: David  Jordan M.D.   On: 04/04/2015 17:22     16 30:  Pt's friend at bedside states pt has 3 bottles of metoprolol at home and she "wonders if he's taking all of them." Pt is bradycardic, but not otherwise symptomatic. Tight ACE wrap RLE removed, +pedal pulse, foot w/d/good color. Pt unable to stand for orthostatic VS. Will dose judicious IVF while workup progresses.   1815:  Pt with very low EF on previous Echo. SBP has increased from 80-115 after IVF  bolus and gtt. Will continue judicious IVF for elevated lactic acid, BUN/Cr and rhabdo. Elevated troponin; EKG has NS STTW changes, pt denies CP. Remains afebrile, WBC count is normal. Bacteria and WBC on Udip; but concerned regarding starting abx as pt has multiple abx allergies as well as new ARF; UC is pending. H/H per baseline. Platelets lower than baseline, but no obvious bleeding. Dx and testing d/w pt and family.  Questions answered.  Verb understanding, agreeable to admit.  T/C to [facility] Dr. Konrad Dolores, case discussed, including:  HPI, pertinent PM/SHx, VS/PE, dx testing, ED course and treatment:  Agreeable to admit, requests to write temporary orders, obtain tele bed to team [facility]Admits.   Samuel Jester, DO 04/07/15 1520

## 2015-04-05 ENCOUNTER — Encounter (HOSPITAL_COMMUNITY): Payer: Self-pay | Admitting: Cardiology

## 2015-04-05 DIAGNOSIS — R001 Bradycardia, unspecified: Secondary | ICD-10-CM

## 2015-04-05 DIAGNOSIS — M6282 Rhabdomyolysis: Secondary | ICD-10-CM

## 2015-04-05 DIAGNOSIS — N189 Chronic kidney disease, unspecified: Secondary | ICD-10-CM

## 2015-04-05 DIAGNOSIS — R55 Syncope and collapse: Secondary | ICD-10-CM

## 2015-04-05 DIAGNOSIS — N179 Acute kidney failure, unspecified: Principal | ICD-10-CM

## 2015-04-05 DIAGNOSIS — E440 Moderate protein-calorie malnutrition: Secondary | ICD-10-CM | POA: Insufficient documentation

## 2015-04-05 DIAGNOSIS — I429 Cardiomyopathy, unspecified: Secondary | ICD-10-CM

## 2015-04-05 LAB — COMPREHENSIVE METABOLIC PANEL
ALBUMIN: 2.8 g/dL — AB (ref 3.5–5.0)
ALK PHOS: 87 U/L (ref 38–126)
ALT: 40 U/L (ref 17–63)
AST: 91 U/L — ABNORMAL HIGH (ref 15–41)
Anion gap: 9 (ref 5–15)
BUN: 85 mg/dL — ABNORMAL HIGH (ref 6–20)
CALCIUM: 8.3 mg/dL — AB (ref 8.9–10.3)
CO2: 25 mmol/L (ref 22–32)
Chloride: 104 mmol/L (ref 101–111)
Creatinine, Ser: 3 mg/dL — ABNORMAL HIGH (ref 0.61–1.24)
GFR calc non Af Amer: 17 mL/min — ABNORMAL LOW (ref >60)
GFR, EST AFRICAN AMERICAN: 19 mL/min — AB (ref >60)
GLUCOSE: 130 mg/dL — AB (ref 65–99)
Potassium: 4.3 mmol/L (ref 3.5–5.1)
Sodium: 138 mmol/L (ref 135–145)
TOTAL PROTEIN: 6 g/dL — AB (ref 6.5–8.1)
Total Bilirubin: 1.2 mg/dL (ref 0.3–1.2)

## 2015-04-05 LAB — CBC
HEMATOCRIT: 37.5 % — AB (ref 39.0–52.0)
Hemoglobin: 12.1 g/dL — ABNORMAL LOW (ref 13.0–17.0)
MCH: 32.7 pg (ref 26.0–34.0)
MCHC: 32.3 g/dL (ref 30.0–36.0)
MCV: 101.4 fL — ABNORMAL HIGH (ref 78.0–100.0)
PLATELETS: 60 10*3/uL — AB (ref 150–400)
RBC: 3.7 MIL/uL — ABNORMAL LOW (ref 4.22–5.81)
RDW: 15.9 % — ABNORMAL HIGH (ref 11.5–15.5)
WBC: 6.2 10*3/uL (ref 4.0–10.5)

## 2015-04-05 LAB — TROPONIN I
Troponin I: 0.25 ng/mL — ABNORMAL HIGH (ref <0.031)
Troponin I: 0.24 ng/mL — ABNORMAL HIGH (ref <0.031)

## 2015-04-05 LAB — TSH: TSH: 4.254 u[IU]/mL (ref 0.350–4.500)

## 2015-04-05 LAB — CK: Total CK: 583 U/L — ABNORMAL HIGH (ref 49–397)

## 2015-04-05 LAB — LACTIC ACID, PLASMA: LACTIC ACID, VENOUS: 2.1 mmol/L — AB (ref 0.5–2.0)

## 2015-04-05 MED ORDER — MIDODRINE HCL 5 MG PO TABS
5.0000 mg | ORAL_TABLET | Freq: Once | ORAL | Status: AC
  Administered 2015-04-05: 5 mg via ORAL
  Filled 2015-04-05: qty 1

## 2015-04-05 MED ORDER — MIDODRINE HCL 5 MG PO TABS
5.0000 mg | ORAL_TABLET | Freq: Three times a day (TID) | ORAL | Status: DC
  Administered 2015-04-05 – 2015-04-07 (×9): 5 mg via ORAL
  Filled 2015-04-05 (×9): qty 1

## 2015-04-05 NOTE — Progress Notes (Signed)
Initial Nutrition Assessment  DOCUMENTATION CODES:  Non-severe (moderate) malnutrition in context of chronic illness  INTERVENTION:  Ensure Enlive (each supplement provides 350kcal and 20 grams of protein)  NUTRITION DIAGNOSIS:  Predicted suboptimal nutrient intake related to chronic illness as evidenced by estimated needs, mild depletion of body fat, mild depletion of muscle mass.  GOAL:  Patient will meet greater than or equal to 90% of their needs  MONITOR:  PO intake, Supplement acceptance, Weight trends, Labs, Skin  REASON FOR ASSESSMENT:  Consult Assessment of nutrition requirement/status  ASSESSMENT: Pt is a pleasant 81 yo who is s/p fall. He is eating cereal with Ensure for breakfast. Feeds himself and says he prepares his own meals at home. Difficult for him to provide list of usual foods for diet hx.  Weight: stable over the past 90 days.  No BM since 6/25.  Nutrition exam: mild-moderate muscle depletions and fat mass loss.  Height:  Ht Readings from Last 1 Encounters:  04/04/15 5\' 5"  (1.651 m)    Weight:  Wt Readings from Last 1 Encounters:  04/04/15 127 lb 6.4 oz (57.788 kg)    Ideal Body Weight:  61.82 kg  Wt Readings from Last 10 Encounters:  04/04/15 127 lb 6.4 oz (57.788 kg)  12/13/14 128 lb (58.06 kg)  09/09/14 120 lb (54.432 kg)  06/10/14 122 lb (55.339 kg)  03/02/14 131 lb (59.421 kg)  12/23/13 126 lb 12.8 oz (57.516 kg)  11/30/13 134 lb 9.6 oz (61.054 kg)  08/26/13 144 lb 8 oz (65.545 kg)  04/08/12 140 lb (63.504 kg)    BMI:  Body mass index is 21.2 kg/(m^2).  Estimated Nutritional Needs:  Kcal:  1740-2030  Protein:  46-55 gr  Fluid:  1500 ml daily  Skin:  Tears to multiple areas (arm, back, hip and shoulder) related to fall  Diet Order:  Diet Heart Room service appropriate?: Yes; Fluid consistency:: Thin  EDUCATION NEEDS:  No education needs identified at this time  No intake or output data in the 24 hours ending  04/05/15 1148  Last BM:  6/25  Royann Shivers MS,RD,CSG,LDN Office: [phone] Pager: [phone]

## 2015-04-05 NOTE — Progress Notes (Signed)
I have had a long conversation with the patient's niece over the phone-explained that patient is persistently hypotensive and with his numerous comorbidities he is poor candidate for aggressive care including pressors. I have explained that we will continue with current plan of treatment with IVF, midodrine and other supportive measures, and have advised against further escalation in care. Niece is understanding and agreeable. If renal function continues to decline, niece is aware that we may need to even consider hospice. Family is still engaging in DNR conversation, I have recommended a DNR order. Family will let us know. I have already consulted Palliative care as well.

## 2015-04-05 NOTE — Plan of Care (Signed)
Lab called RN with critical: Lactic Acid 2.1 - Dr. Truitt Merle via text.

## 2015-04-05 NOTE — Plan of Care (Signed)
Dr. And RN discussed code status options with niece and patient. Patient requesting to remain Full Code with no restrictions.  Dr. Informed.

## 2015-04-05 NOTE — Progress Notes (Addendum)
PATIENT DETAILS Name: Calvin Rivas Age: 81 y.o. Sex: male Date of Birth: [birth year] Admit Date: 04/04/2015 Admitting Physician Ozella Rocks, MD RUE:AVWUJWJ,XBJYNWGN, MD  Subjective: Feels weak-denies lightheadedness or SOB. Has chronic lower ext edema.   Assessment/Plan: Active Problems: Acute on CKD stage 3: suspect ARF from pre-renal azotemia and Rhabdomyolysis. Gently hydrate, hold all antihypertensives and statins. Have asked RN to place condom cath to monitor strict intake/output. Check daily weights. Follow creatinine.   Hypotension:very poor historian-but suspect that this is multifactorial from probable medications (? Incorrectly taking meds-per niece 4 bottles of metoprolol that patient may have been taking simultaneouly) and underlying chronic systolic CHF. No fever/infection foci to suggest sepsis. No hx of volume loss. Continue to hold anti-hypertensives, start Midodrine and follow. Does not seem to be symptomatic. Poor candidate for aggressive care-have discussed with Niece over the phone. Will await family discussion regarding DNR status and place a palliative care consultation  Sinus Bradycardia:suspect secondary to ?excessive use of Metoprolol.Does not seem symptomatic at this time-but BP is soft. Hold metoprolol- Check TSH. Monitor in telemetry.   Rhabdomyolysis: secondary to prolonged immobilization. Cautiously hydrate and follow  Generalized weakness: probably secondary to above noted issues. PT eval.   Mechanical fall:occured on Saturday.Denies syncope. Likely secondary to hypotension from incorrectly taking medications.  Mild acute on Chronic Systolic CHF: has leg edema/few bibasilar rales. Stop IVF once BP slightly better. Once BP permits restart Lasix.  Thrombocytopenia:seems to be a chronic issue-follow for now  Chronic Lower Ext edema: uses Unna boots to control edema. Resume over the next few days.   Social Issues:lives alone, question  whether patient is taking excessive medications inadvertantly, frequent falls-suspect will need SNF on discharge. Social work evaluation  Palliative Care:spoke with niece/patient-given advanced age/CKD/Chronic syst CHF-recommended that we start conversation about end of life issues. Recommended that we start with a DNR order, niece will discuss with patient and will let us know.  Disposition: Remain inpatient-suspect will require SNF on discharge.   Antimicrobial agents  See below  Anti-infectives    None      DVT Prophylaxis: Prophylactic Heparin   Code Status: Full code  Family Communication Glory Rosebush Fergusion [phone] over the phone  Procedures: None  CONSULTS:  cardiology and nephrology  Time spent 40 minutes-Greater than 50% of this time was spent in counseling, explanation of diagnosis, planning of further management, and coordination of care.  MEDICATIONS: Scheduled Meds: . sodium chloride   Intravenous STAT  . feeding supplement (ENSURE ENLIVE)  237 mL Oral BID BM  . heparin  5,000 Units Subcutaneous 3 times per day  . midodrine  5 mg Oral TID WC  . sodium chloride  3 mL Intravenous Q12H   Continuous Infusions: . sodium chloride    . sodium chloride     PRN Meds:.acetaminophen **OR** acetaminophen, ondansetron **OR** ondansetron (ZOFRAN) IV, oxyCODONE    PHYSICAL EXAM: Vital signs in last 24 hours: Filed Vitals:   04/04/15 2030 04/04/15 2100 04/04/15 2145 04/05/15 0553  BP: 85/49 98/55 107/56 87/41  Pulse: 44 49 48 47  Temp:   97.6 F (36.4 C) 97.9 F (36.6 C)  TempSrc:   Oral Oral  Resp: 7  10 12   Height:   5\' 5"  (1.651 m)   Weight:   57.788 kg (127 lb 6.4 oz)   SpO2: 93% 92% 95% 95%    Weight change:  [employer]  04/04/15 1545 04/04/15 2145  Weight: 58.968 kg (130 lb) 57.788 kg (127 lb 6.4 oz)   Body mass index is 21.2 kg/(m^2).   Gen Exam: Awake and alert with clear speech.  Neck: Supple, No JVD.   Chest: Few bibasilar  rales CVS: S1 S2 Regular, no murmurs.  Abdomen: soft, BS +, non tender, non distended.  Extremities: 2-3 +edema, lower extremities warm to touch. Neurologic: Non Focal.   Skin: No Rash.   Wounds: N/  Intake/Output from previous day: No intake or output data in the 24 hours ending 04/05/15 0840   LAB RESULTS: CBC  Recent Labs Lab 04/04/15 1632 04/05/15 0231  WBC 7.3 6.2  HGB 12.4* 12.1*  HCT 37.7* 37.5*  PLT 64* 60*  MCV 101.6* 101.4*  MCH 33.4 32.7  MCHC 32.9 32.3  RDW 15.9* 15.9*  LYMPHSABS 0.7  --   MONOABS 0.7  --   EOSABS 0.1  --   BASOSABS 0.0  --     Chemistries   Recent Labs Lab 04/04/15 1632 04/05/15 0231  NA 137 138  K 4.0 4.3  CL 101 104  CO2 26 25  GLUCOSE 126* 130*  BUN 87* 85*  CREATININE 2.93* 3.00*  CALCIUM 8.7* 8.3*    CBG: No results for input(s): GLUCAP in the last 168 hours.  GFR Estimated Creatinine Clearance: 12.8 mL/min (by C-G formula based on Cr of 3).  Coagulation profile No results for input(s): INR, PROTIME in the last 168 hours.  Cardiac Enzymes  Recent Labs Lab 04/04/15 1632 04/04/15 2010 04/05/15 0231  TROPONINI 0.31* 0.31* 0.25*    Invalid input(s): POCBNP No results for input(s): DDIMER in the last 72 hours. No results for input(s): HGBA1C in the last 72 hours. No results for input(s): CHOL, HDL, LDLCALC, TRIG, CHOLHDL, LDLDIRECT in the last 72 hours. No results for input(s): TSH, T4TOTAL, T3FREE, THYROIDAB in the last 72 hours.  Invalid input(s): FREET3 No results for input(s): VITAMINB12, FOLATE, FERRITIN, TIBC, IRON, RETICCTPCT in the last 72 hours. No results for input(s): LIPASE, AMYLASE in the last 72 hours.  Urine Studies No results for input(s): UHGB, CRYS in the last 72 hours.  Invalid input(s): UACOL, UAPR, USPG, UPH, UTP, UGL, UKET, UBIL, UNIT, UROB, ULEU, UEPI, UWBC, URBC, UBAC, CAST, UCOM, BILUA  MICROBIOLOGY: No results found for this or any previous visit (from the past 240  hour(s)).  RADIOLOGY STUDIES/RESULTS: Dg Chest 1 View  04/04/2015   CLINICAL DATA:  Larey Seat from wheelchair at church 2 days ago. Initial encounter.  EXAM: CHEST  1 VIEW  COMPARISON:  November 26, 2013.  FINDINGS: Stable cardiomegaly. No pneumothorax is noted. Increased interstitial densities are noted throughout both lungs concerning for vascular congestion and pulmonary edema. Probable left pleural effusion is noted. Bony thorax is intact. Multilevel degenerative disc disease is noted in thoracic spine.  IMPRESSION: Cardiomegaly is noted with increased interstitial densities throughout both lungs concerning for pulmonary edema and possible congestive heart failure. Mild left pleural effusion is noted which appears to be increased compared to prior exam.   Electronically Signed   By: Lupita Raider, M.D.   On: 04/04/2015 17:19   Ct Head Wo Contrast  04/04/2015   CLINICAL DATA:  Weakness after fall from wheelchair 2 days ago.  EXAM: CT HEAD WITHOUT CONTRAST  TECHNIQUE: Contiguous axial images were obtained from the base of the skull through the vertex without intravenous contrast.  COMPARISON:  CT scan of August 24, 2013.  FINDINGS: Bony calvarium  appears intact. Mild diffuse cortical atrophy is noted. Mild chronic ischemic white matter disease is noted. No mass effect or midline shift is noted. Ventricular size is within normal limits. There is no evidence of mass lesion, hemorrhage or acute infarction. 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  IMPRESSION: Mild diffuse cortical atrophy. Mild chronic ischemic white matter disease. No acute intracranial abnormality seen.   Electronically Signed   By: Lupita Raider, M.D.   On: 04/04/2015 17:34   Dg Hips Bilat With Pelvis 2v  04/04/2015   CLINICAL DATA:  Status post fall striking the buttocks on Saturday ; patient not found until Sunday ; patient reports no pain but is week  EXAM: BILATERAL HIP (WITH PELVIS) 2 VIEWS  COMPARISON:  None.  FINDINGS:  The bony pelvis is osteopenic. There is deformity of the inferior pubic ramus on the left that is of uncertain age. The observed portions of the sacrum exhibit no acute abnormalities. The SI joints are unremarkable. There is symmetric narrowing of the hip joint spaces bilaterally. The femoral heads, necks, and intertrochanteric regions are normal.  IMPRESSION: Deformity of the inferior pubic ramus on the left that is of uncertain age. No acute fracture is demonstrated elsewhere. There is degenerative narrowing of the hip joint spaces bilaterally.  If there are strong clinical concerns that the left inferior pubic ramus abnormality is acute or that there is occult pathology elsewhere, pelvic MRI or CT scanning would be useful.   Electronically Signed   By: David  [place] M.D.   On: 04/04/2015 17:22    Jeoffrey Massed, MD  [facility] Pager:[phone]  If 7PM-7AM, please contact night-coverage [email or web address] Password [email or web address] 04/05/2015, 8:40 AM   LOS: 1 day

## 2015-04-05 NOTE — Progress Notes (Signed)
PT Cancellation Note  Patient Details Name: Calvin Rivas MRN: [id number] DOB: 09/07/1921   Cancelled Treatment:    Reason Eval/Treat Not Completed: Patient not medically ready.  Vital signs are not stable.   Myrlene Broker L 04/05/2015, 1:09 PM

## 2015-04-05 NOTE — Consult Note (Signed)
CARDIOLOGY CONSULT NOTE   Patient ID: Calvin Rivas MRN: [id number] DOB/AGE: [birth year] 81 y.o.  Admit Date: 04/04/2015 Referring Physician: PTH-Ghimire MD Primary Physician: Glori Bickers, MD Consulting Cardiologist: Nona Dell MD Primary Cardiologist: Prentice Docker MD Reason for Consultation: Bradycardia   Clinical Summary Mr. Surratt is a 81 y.o.male with known history of cardiomyopathy managed medically, LVEF 20-25%, chronic systolic and diastolic CHF, hyperlipidemia, CKD II, admitted after a falling at home with hx of falls over the last 2 weeks. He denies falling. 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 Once he fiished his work he sat down on the chair but the cushion slipped and he fell to the floor. He was unable to get up on his own, and waited until the following morning when the church opened, someone found him and called ambulance. He denies losing consciousness.  He has some dementia and is a difficult historian. Hx is obtained from current and PMH.   According to the chart, the patient has been on metoprolol and has several bottles at home. He lives alone. As per the last note by Dr. [name] in March 2015, the patient was taking lisinopril 2.5 mg daily, Toprol-XL at 25 mg daily, and Lasix 40 mg daily. He has a daughter who visits from [facility] [place]. Niece has come down to be with him. There is a concern that he was not taking his medications correctly.   He denies chest pain or dyspnea, syncope or dizziness.    Allergies  Allergen Reactions  . Penicillins Rash and Other (See Comments)    Pt states dr told him if he took penicillin again "it could be the last time"  . Castor Oil     Pt. States he has seizures from ingesting it    Medications Scheduled Medications: . feeding supplement (ENSURE ENLIVE)  237 mL Oral BID BM  . heparin  5,000 Units Subcutaneous 3 times per  day  . midodrine  5 mg Oral TID WC  . sodium chloride  3 mL Intravenous Q12H    Infusions: . sodium chloride    . sodium chloride 75 mL/hr at 04/05/15 1137    PRN Medications: acetaminophen **OR** acetaminophen, ondansetron **OR** ondansetron (ZOFRAN) IV, oxyCODONE   Past Medical History  Diagnosis Date  . Essential hypertension   . Hypercholesterolemia   . GERD (gastroesophageal reflux disease)   . History of arm fracture   . History of fracture of leg   . Chronic systolic heart failure   . Cardiomyopathy     LVEF 20-25%    Past Surgical History  Procedure Laterality Date  . No past surgeries      Family History  Problem Relation Age of Onset  . Family history unknown: Yes    Social History Mr. Whiteford reports that he has never smoked. He has never used smokeless tobacco. Mr. Yurkovich reports that he does not drink alcohol.  Review of Systems Complete review of systems are found to be negative unless outlined in H&P above.  Physical Examination Blood pressure 75/40, pulse 52, temperature 97.9 F (36.6 C), temperature source Oral, resp. rate 12, height 5\' 5"  (1.651 m), weight 127 lb 6.4 oz (57.788 kg), SpO2 95 %. No intake or output data in the 24 hours ending 04/05/15 1143  Telemetry: Sinus bradycardia without pauses or arrhythmia  GEN: No acute distress  HEENT: Conjunctiva and lids normal, oropharynx clear  with moist mucosa. Neck: Supple, no elevated JVP or carotid bruits, no thyromegaly. Lungs: Clear to auscultation, nonlabored breathing at rest. Cardiac: Regular rate and rhythm, no S3 or significant systolic murmur, no pericardial rub. Abdomen: Soft, nontender, no hepatomegaly, bowel sounds present, no guarding or rebound. Extremities: No pitting edema, distal pulses 2+. Skin: Warm and dry. Musculoskeletal: No kyphosis. Neuropsychiatric: Alert and oriented x3, affect grossly appropriate.  Prior Cardiac Testing/Procedures 1. Echocardiogram  11/27/2013 Study data: Technically adequate study. - Left ventricle: The cavity size was normal. Wall thickness was increased in a pattern of moderate LVH. Systolic function was severely reduced. The estimated ejection fraction was in the range of 20% to 25%. Findings consistent with left ventricular diastolic dysfunction, grade indeterminant. There is evidence of elevated LA pressure (E/e' 18). - Regional wall motion abnormality: Hypokinesis of the basal-mid anteroseptal, mid inferoseptal, and apical septal myocardium. - Aortic valve: Moderately calcified annulus. Trileaflet; mildly thickened leaflets. Valve area: 1.85cm^2(VTI). Valve area: 1.76cm^2 (Vmax). - Mitral valve: Mildly calcified annulus. Mildly thickened leaflets . Mild regurgitation. - Left atrium: The atrium was moderately dilated. - Right ventricle: The cavity size was moderately to severely dilated. Systolic function was severely reduced. RV TAPSE is 0.6 cm. - Right atrium: The atrium was moderately to severely dilated. - Pulmonary arteries: Systolic pressure was moderately increased in the setting of severe RV dysfunction. PA peak pressure: 60mm Hg (S). - Inferior vena cava: The vessel was dilated; the respirophasic diameter changes were blunted (< 50%); findings are consistent with elevated central venous pressure.  Lab Results  Basic Metabolic Panel:  Recent Labs Lab 04/04/15 1632 04/05/15 0231  NA 137 138  K 4.0 4.3  CL 101 104  CO2 26 25  GLUCOSE 126* 130*  BUN 87* 85*  CREATININE 2.93* 3.00*  CALCIUM 8.7* 8.3*    Liver Function Tests:  Recent Labs Lab 04/05/15 0231  AST 91*  ALT 40  ALKPHOS 87  BILITOT 1.2  PROT 6.0*  ALBUMIN 2.8*    CBC:  Recent Labs Lab 04/04/15 1632 04/05/15 0231  WBC 7.3 6.2  NEUTROABS 5.8  --   HGB 12.4* 12.1*  HCT 37.7* 37.5*  MCV 101.6* 101.4*  PLT 64* 60*    Cardiac Enzymes:  Recent Labs Lab 04/04/15 1632  04/04/15 2010 04/05/15 0231 04/05/15 0822 04/05/15 1004  CKTOTAL 1525*  --   --   --  583*  TROPONINI 0.31* 0.31* 0.25* 0.24*  --      Radiology: Dg Chest 1 View  04/04/2015   CLINICAL DATA:  Larey Seat from wheelchair at church 2 days ago. Initial encounter.  EXAM: CHEST  1 VIEW  COMPARISON:  November 26, 2013.  FINDINGS: Stable cardiomegaly. No pneumothorax is noted. Increased interstitial densities are noted throughout both lungs concerning for vascular congestion and pulmonary edema. Probable left pleural effusion is noted. Bony thorax is intact. Multilevel degenerative disc disease is noted in thoracic spine.  IMPRESSION: Cardiomegaly is noted with increased interstitial densities throughout both lungs concerning for pulmonary edema and possible congestive heart failure. Mild left pleural effusion is noted which appears to be increased compared to prior exam.   Electronically Signed   By: Lupita Raider, M.D.   On: 04/04/2015 17:19   Ct Head Wo Contrast  04/04/2015   CLINICAL DATA:  Weakness after fall from wheelchair 2 days ago.  EXAM: CT HEAD WITHOUT CONTRAST  TECHNIQUE: Contiguous axial images were obtained from the base of the skull through the vertex without intravenous contrast.  COMPARISON:  CT scan of August 24, 2013.  FINDINGS: Bony calvarium appears intact. Mild diffuse cortical atrophy is noted. Mild chronic ischemic white matter disease is noted. No mass effect or midline shift is noted. Ventricular size is within normal limits. There is no evidence of mass lesion, hemorrhage or acute infarction. 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  IMPRESSION: Mild diffuse cortical atrophy. Mild chronic ischemic white matter disease. No acute intracranial abnormality seen.   Electronically Signed   By: Lupita Raider, M.D.   On: 04/04/2015 17:34   Dg Hips Bilat With Pelvis 2v  04/04/2015   CLINICAL DATA:  Status post fall striking the buttocks on Saturday ; patient not found until  Sunday ; patient reports no pain but is week  EXAM: BILATERAL HIP (WITH PELVIS) 2 VIEWS  COMPARISON:  None.  FINDINGS: The bony pelvis is osteopenic. There is deformity of the inferior pubic ramus on the left that is of uncertain age. The observed portions of the sacrum exhibit no acute abnormalities. The SI joints are unremarkable. There is symmetric narrowing of the hip joint spaces bilaterally. The femoral heads, necks, and intertrochanteric regions are normal.  IMPRESSION: Deformity of the inferior pubic ramus on the left that is of uncertain age. No acute fracture is demonstrated elsewhere. There is degenerative narrowing of the hip joint spaces bilaterally.  If there are strong clinical concerns that the left inferior pubic ramus abnormality is acute or that there is occult pathology elsewhere, pelvic MRI or CT scanning would be useful.   Electronically Signed   By: David  [place] M.D.   On: 04/04/2015 17:22    ECG: Sinus bradycardia at 47 with PAC.  Impression and Recommendations  1. Bradycardia: EKG demonstrates significant bradycardia in the 40s with a PAC. He has already been taken off of BB by admitting physician. Otherwise nonspecific changes are noted. There was slight elevation in troponin of 0.31; 0.25; 0.24 respectively likely from demand ischemia rather than true ACS, and also contributed to by acute on chronic renal failure (Creatinine is 3.0). He still drives and would not recommend he continue to do this any longer. He denies LOC but there is some dementia and therefore hx cannot be reliable. Do not think he is a candidate for PPM at this time. Watch heart rate for the next 24-48 to look for improvement. Consider SNF placement.   2. Cardiomyopathy: EF of 20-25% with systolic CHF: He has 2+ pitting edema on assessment, however he is hypotensive and in acute on chronic renal failure. Albumin is 2.8. Consider renal consultation. Sodium is normal. Pro-BNP 2,864. Overall prognosis is  poor.  3. Hx of Hypertension: Hypotensive today off of ACE with CKD II as well.   4. Dementia: He is a poor historian, but is insistent that he he did not pas out, only fell. He apparently had been falling prior to admission. May need placement. Will defer to PTH.    Signed: Bettey Mare. Lawrence NP [facility]  04/05/2015, 11:43 AM Co-Sign MD   Attending note:  Patient seen and examined. I reviewed records and discussed the case with Ms. Lawrence NP. Agree with the above assessment. Mr. Trumbull presents after an apparent fall, unclear if there was any loss of consciousness, history is somewhat limited from the patient however. He has evidence of acute on chronic renal failure with known stage III CKD at baseline, also cardiomyopathy with LVEF 20-25% that has been managed conservatively by Dr. [name]. He is hypotensive  with systolic blood pressures in the 70s to 80s, no chest pain or palpitations reported. He is also found to be bradycardic, sinus bradycardia with PAC noted on ECG. Telemetry shows no pauses. There is some concern that he may not have been taking his medications at home correctly, last note by Dr. [name] indicates that he was taking lisinopril 2.5 mg daily, Toprol-XL at 25 mg daily, and Lasix 40 mg daily. These medications have presently been held. Creatinine is up to 3.0. On examination he reports no specific complaints, provides somewhat limited history. His lungs exhibit diminished breath sounds at the bases with no rales, cardiac exam reveals RRR without gallop, indistinct PMI, there is chronic appearing lower leg edema and stasis. Chest x-ray reports cardiomegaly with interstitial edema and a small left pleural effusion. From a cardiac perspective, agree with holding his current medications, follow up on both heart rate and blood pressure response. There may be a component of intravascular volume depletion or ATN, could consider nephrology consultation although suspect conservative  measures would be pursued at this point. There is no clear indication for a pacemaker at this particular time. Would continue to follow telemetry. Overall prognosis is poor. Agree with primary team that patient needs further evaluation by case management for potential consideration of SNF and realistic end-of-life discussions. He should not drive at this time in light of potential syncopal event, details of etiology not clear.  Jonelle Sidle, M.D., F.A.C.C.

## 2015-04-05 NOTE — Discharge Instructions (Signed)

## 2015-04-05 NOTE — Consult Note (Signed)
Calvin Rivas MRN: [id number] DOB/AGE: 79-Jun-1922 79 y.o. Primary Care Physician:TRIVEDI,RAJENDRA, MD Admit date: 04/04/2015 Chief Complaint:  Chief Complaint  Patient presents with  . Fall  . Weakness   HPI: Pt is 81 year old male with past medical hx of CHF who presented to ER with c/o weakness and fall.  HPI dates back to 2 week ago when pt started feeling weak, it was progressively worsening. Pt had 2-3  Falls in past 2 week, with last fall happening on Saturday from which pt was not able to get up till next day. Pt offers no c/o chest pain No c/o fever/cough/chills  NO c/o dyspnea NO c/o nausea/vomiting/diarrhea No c/o syncope NO c/o head trauma    Past Medical History  Diagnosis Date  . Essential hypertension   . Hypercholesterolemia   . GERD (gastroesophageal reflux disease)   . History of arm fracture   . History of fracture of leg   . Chronic systolic heart failure   . Cardiomyopathy     LVEF 20-25%       Family History  Problem Relation Age of Onset  . Family history unknown: Yes    Social History:  reports that he has never smoked. He has never used smokeless tobacco. He reports that he does not drink alcohol or use illicit drugs.   Allergies:  Allergies  Allergen Reactions  . Penicillins Rash and Other (See Comments)    Pt states dr told him if he took penicillin again "it could be the last time"  . Castor Oil     Pt. States he has seizures from ingesting it    Medications Prior to Admission  Medication Sig Dispense Refill  . CALCIUM PO Take 1 tablet by mouth daily.    . clarithromycin (BIAXIN) 250 MG tablet Take 250 mg by mouth 2 (two) times daily.    . furosemide (LASIX) 40 MG tablet Take 1 tablet (40 mg total) by mouth daily. 90 tablet 3  . lisinopril (PRINIVIL,ZESTRIL) 2.5 MG tablet Take 2.5 mg by mouth daily.  6  . metoprolol succinate (TOPROL-XL) 25 MG 24 hr tablet Take 1 tablet (25 mg total) by mouth daily.    . simvastatin (ZOCOR) 40 MG  tablet Take 1 tablet (40 mg total) by mouth every evening. 30 tablet 0  . Vitamin D, Ergocalciferol, (DRISDOL) 50000 UNITS CAPS capsule Take 50,000 Units by mouth every 30 (thirty) days.     [employer] Bandages & Supports (MEDICAL COMPRESSION STOCKINGS) MISC 1 each by Does not apply route as directed. 1 PAIR 20-30 MM OR MEDIUM PRESSURE DX: LEG EDEMA, CHF (Patient not taking: Reported on 04/04/2015) 1 each 0       WUJ:WJXBJ from the symptoms mentioned above,there are no other symptoms referable to all systems reviewed.  . feeding supplement (ENSURE ENLIVE)  237 mL Oral BID BM  . heparin  5,000 Units Subcutaneous 3 times per day  . midodrine  5 mg Oral TID WC  . sodium chloride  3 mL Intravenous Q12H      Physical Exam: Vital signs in last 24 hours: Temp:  [97.6 F (36.4 C)-98.1 F (36.7 C)] 98.1 F (36.7 C) (06/28 1451) Pulse Rate:  [44-52] 50 (06/28 1500) Resp:  [7-15] 12 (06/28 0553) BP: (72-115)/(35-64) 75/35 mmHg (06/28 1500) SpO2:  [90 %-96 %] 96 % (06/28 1451) Weight:  [127 lb 6.4 oz (57.788 kg)] 127 lb 6.4 oz (57.788 kg) (06/27 2145) Weight change:  Last BM Date: 04/02/15  Intake/Output from previous day:   Total I/O In: 200 [P.O.:200] Out: -    Physical Exam: General- pt is awake,alert, follows coomands Resp- No acute REsp distress, CTA B/L NO Rhonchi CVS- S1S2 regular in rate and rhythm GIT- BS+, soft, NT, ND EXT- 1+ LE Edema, Cyanosis CNS- CN 2-12 grossly intact. Moving all 4 extremities Psych- normal mood and affect    Lab Results: CBC  Recent Labs  04/04/15 1632 04/05/15 0231  WBC 7.3 6.2  HGB 12.4* 12.1*  HCT 37.7* 37.5*  PLT 64* 60*    BMET  Recent Labs  04/04/15 1632 04/05/15 0231  NA 137 138  K 4.0 4.3  CL 101 104  CO2 26 25  GLUCOSE 126* 130*  BUN 87* 85*  CREATININE 2.93* 3.00*  CALCIUM 8.7* 8.3*   Creat trend  2016  2.93=>3.0 2015   1.6--1.7 2014   1.5--1.6   MICRO Recent Results (from the past 240 hour(s))  Urine  culture     Status: None (Preliminary result)   Collection Time: 04/04/15  5:30 PM  Result Value Ref Range Status   Specimen Description URINE, CLEAN CATCH  Final   Special Requests NONE  Final   Culture   Final    CULTURE REINCUBATED FOR BETTER GROWTH Performed at [facility]    Report Status PENDING  Incomplete      Lab Results  Component Value Date   PTH 84.7* 08/28/2013   CALCIUM 8.3* 04/05/2015   PHOS 3.2 08/28/2013    IMPRESSION: Cardiomegaly is noted with increased interstitial densities throughout both lungs concerning for pulmonary edema and possible congestive heart failure. Mild left pleural effusion is noted which appears to be increased compared to prior exam.  Impression: 1)Renal  AKI secondary to prerenal/ATN               AKi sec to Hypotension                                 Hypovolemia                                 ACE                                             AKI now at plateau                AKi on CKD               CKD stage 3.               CKD since 2014               CKD secondary to Cardiorenal/ age associated decline                Progression of CKD marked with AKI                2)CVs- hypotensive                On Midodrine  3)Anemia HGb stable  4)CKD Mineral-Bone Disorder PTH acceptable Secondary Hyperparathyroidism present  Phosphorus at goal.   5)CHF- hx of CHF Cardiology and  Primary MD following  6)Electrolytes Normokalemic NOrmonatremic  7)Acid base Co2 at goal     Plan:  Will ask for renal u/s Will ask for FENa Agree with IVf Will add lasix in am ( hopefully Bp would be better) Educated pt about poor prognosis sec to Multiple commorbidities      Cristo Ausburn S 04/05/2015, 3:54 PM

## 2015-04-05 NOTE — Clinical Social Work Note (Signed)
CSW received consult for possible placement. Pt is not medically stable for PT evaluation. Palliative care consult also pending. CSW will follow up tomorrow.  Derenda Fennel, LCSW [phone]

## 2015-04-05 NOTE — Plan of Care (Signed)
Update-Recent vitals 72/35 and 47HR. Patient remains asymptomatic. Cardio and [employer]. No further orders at this time.

## 2015-04-06 ENCOUNTER — Inpatient Hospital Stay (HOSPITAL_COMMUNITY): Payer: Medicare Other

## 2015-04-06 ENCOUNTER — Encounter (HOSPITAL_COMMUNITY): Payer: Self-pay | Admitting: Primary Care

## 2015-04-06 DIAGNOSIS — Z7189 Other specified counseling: Secondary | ICD-10-CM

## 2015-04-06 DIAGNOSIS — Z515 Encounter for palliative care: Secondary | ICD-10-CM

## 2015-04-06 DIAGNOSIS — W19XXXD Unspecified fall, subsequent encounter: Secondary | ICD-10-CM

## 2015-04-06 LAB — CBC
HCT: 39.5 % (ref 39.0–52.0)
Hemoglobin: 12.6 g/dL — ABNORMAL LOW (ref 13.0–17.0)
MCH: 32.6 pg (ref 26.0–34.0)
MCHC: 31.9 g/dL (ref 30.0–36.0)
MCV: 102.3 fL — AB (ref 78.0–100.0)
Platelets: 82 10*3/uL — ABNORMAL LOW (ref 150–400)
RBC: 3.86 MIL/uL — AB (ref 4.22–5.81)
RDW: 16.2 % — ABNORMAL HIGH (ref 11.5–15.5)
WBC: 6.8 10*3/uL (ref 4.0–10.5)

## 2015-04-06 LAB — COMPREHENSIVE METABOLIC PANEL
ALT: 39 U/L (ref 17–63)
ANION GAP: 9 (ref 5–15)
AST: 67 U/L — ABNORMAL HIGH (ref 15–41)
Albumin: 2.8 g/dL — ABNORMAL LOW (ref 3.5–5.0)
Alkaline Phosphatase: 89 U/L (ref 38–126)
BUN: 93 mg/dL — ABNORMAL HIGH (ref 6–20)
CO2: 25 mmol/L (ref 22–32)
Calcium: 8 mg/dL — ABNORMAL LOW (ref 8.9–10.3)
Chloride: 105 mmol/L (ref 101–111)
Creatinine, Ser: 2.99 mg/dL — ABNORMAL HIGH (ref 0.61–1.24)
GFR calc non Af Amer: 17 mL/min — ABNORMAL LOW (ref >60)
GFR, EST AFRICAN AMERICAN: 19 mL/min — AB (ref >60)
GLUCOSE: 117 mg/dL — AB (ref 65–99)
Potassium: 4.3 mmol/L (ref 3.5–5.1)
Sodium: 139 mmol/L (ref 135–145)
Total Bilirubin: 1.5 mg/dL — ABNORMAL HIGH (ref 0.3–1.2)
Total Protein: 6.1 g/dL — ABNORMAL LOW (ref 6.5–8.1)

## 2015-04-06 LAB — URINE CULTURE

## 2015-04-06 LAB — CK: CK TOTAL: 338 U/L (ref 49–397)

## 2015-04-06 MED ORDER — FUROSEMIDE 40 MG PO TABS
40.0000 mg | ORAL_TABLET | Freq: Every day | ORAL | Status: DC
  Administered 2015-04-06: 40 mg via ORAL
  Filled 2015-04-06: qty 1
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
## 2015-04-06 NOTE — Consult Note (Signed)
Consultation Note Date: 04/06/2015   Patient Name: Calvin Rivas  DOB: 1920-12-30  MRN: [id number]  Age / Sex: 81 y.o., male   PCP: Glori Bickers, MD Referring Physician: Renae Fickle, MD  Reason for Consultation: Establishing goals of care and Psychosocial/spiritual support  Palliative Care Assessment and Plan Summary of Established Goals of Care and Medical Treatment Preferences   Clinical Assessment/Narrative: Calvin Rivas is sitting quietly in his gerichair in this room,  His niece Lupita Leash is at his bedside.  He shares many stories of his past, and likes to talk about his 'collections'. He states that he has gone down hill since he fell on an escalator in 2014. He also states his kidney doctor in [place] changed some of this medications in May and he has been weak since.  We talk about his current weakness, his ADL and IADL's, and his low heart rate, chronic kidney disease, and his medication management routines.   He does bathe at the sink as he is afraid to fall in the tub.  He states he has a Rolator walker and cane, but no w/c.  His goals are to get better and return to his previous level of function; he has concerns about going to rehab, but also doesn't want anyone in his home.   He states he will lean on others with more experience and knowledge in directing him on how to best return to independence.  He agrees to go to either [facility] or [facility] if they have a place for him.  We talk about recording some of his stories, and his niece, Lupita Leash, is very encouraging in this.  He becomes tearful at times talking about his wife who passed 3 years ago.    Contacts/Participants in Discussion: Primary Decision Maker: Mr. Majewski is able to make his own choices at this time.  He does have a niece, Lupita Leash, who lives in [place], and a sister, Lucendia Herrlich who are somewhat involved in his care.  HCPOA: no  We discussed the benefits of HCPOA today, but Mr. Laracuente is not ready to make this  decision or give up any autonomy.   Code Status/Advance Care Planning:  FULL   We talked at length about Mr. Kadow's goals for a full life and his desire to live independently.  He states several times that he would rather be "6 feet under" than to live in a long term care facility or have limitations on his life. He is, however, unwilling to make any decisions today.  His sister, brother in law, and niece are present for this conversation and hear these statements on his wishes.   Symptom Management:   Mr. Barling denies pain, dyspnea, anxiety, or N/V.   Psycho-social/Spiritual:   Support System:  Mr. Repinski lives alone and provides for his own care.  He does have a male friend who get groceries for him.  He has a niece, Lupita Leash, who lives in [place], and a sister, Lucendia Herrlich who are somewhat involved in his care.   Desire for further Chaplaincy support:  Not discussed today.   Prognosis: Unable to determine  Discharge Planning:  Skilled Nursing Facility for rehab with Palliative care service follow-up       Chief Complaint:  Fall History of Present Illness:  Calvin Rivas is a 81 y.o. Male who lives at home alone who presented with gradual onset of weakness with recurrent falls, particularly in the 2 weeks prior to admission. The night prior to admission, he was  at the church cleaning and fell and was unable to get up until he was found by church patrons the following morning. He has been hypotensive with oliguric AKI and demand ischemia. He is full code but previous MD had recommended DNR and he is currently still on floor status on midodrine with clinical improvement  Primary Diagnoses  Present on Admission:  . Acute renal failure  Palliative Review of Systems: Calvin Rivas denies pain, dyspnea, anxiety, or N/V.   I have reviewed the medical record, interviewed the patient and family, and examined the patient. The following aspects are pertinent.  Past Medical History  Diagnosis  Date  . Essential hypertension   . Hypercholesterolemia   . GERD (gastroesophageal reflux disease)   . History of arm fracture   . History of fracture of leg   . Chronic systolic heart failure   . Cardiomyopathy     LVEF 20-25%   History   Social History  . Marital Status: Widowed    Spouse Name: N/A  . Number of Children: N/A  . Years of Education: N/A   Social History Main Topics  . Smoking status: Never Smoker   . Smokeless tobacco: Never Used  . Alcohol Use: No  . Drug Use: No  . Sexual Activity: No   Other Topics Concern  . None   Social History Narrative   Family History  Problem Relation Age of Onset  . Family history unknown: Yes   Scheduled Meds: . feeding supplement (ENSURE ENLIVE)  237 mL Oral BID BM  . furosemide  40 mg Oral Daily  . heparin  5,000 Units Subcutaneous 3 times per day  . midodrine  5 mg Oral TID WC  . sodium chloride  3 mL Intravenous Q12H   Continuous Infusions:  PRN Meds:.acetaminophen **OR** acetaminophen, ondansetron **OR** ondansetron (ZOFRAN) IV, oxyCODONE Medications Prior to Admission:  Prior to Admission medications   Medication Sig Start Date End Date Taking? Authorizing Provider  CALCIUM PO Take 1 tablet by mouth daily.   Yes Historical Provider, MD  clarithromycin (BIAXIN) 250 MG tablet Take 250 mg by mouth 2 (two) times daily.   Yes Historical Provider, MD  furosemide (LASIX) 40 MG tablet Take 1 tablet (40 mg total) by mouth daily. 09/13/14  Yes Laqueta Linden, MD  lisinopril (PRINIVIL,ZESTRIL) 2.5 MG tablet Take 2.5 mg by mouth daily. 03/01/15  Yes Historical Provider, MD  metoprolol succinate (TOPROL-XL) 25 MG 24 hr tablet Take 1 tablet (25 mg total) by mouth daily. 06/10/14  Yes Laqueta Linden, MD  simvastatin (ZOCOR) 40 MG tablet Take 1 tablet (40 mg total) by mouth every evening. 08/28/13  Yes Freeman Caldron, PA-C  Vitamin D, Ergocalciferol, (DRISDOL) 50000 UNITS CAPS capsule Take 50,000 Units by mouth every 30  (thirty) days.    Yes Historical Provider, MD  Elastic Bandages & Supports (MEDICAL COMPRESSION STOCKINGS) MISC 1 each by Does not apply route as directed. 1 PAIR 20-30 MM OR MEDIUM PRESSURE DX: LEG EDEMA, CHF Patient not taking: Reported on 04/04/2015 09/09/14   Laqueta Linden, MD   Allergies  Allergen Reactions  . Penicillins Rash and Other (See Comments)    Pt states dr told him if he took penicillin again "it could be the last time"  . Castor Oil     Pt. States he has seizures from ingesting it   CBC:    Component Value Date/Time   WBC 6.8 04/06/2015 0626   HGB 12.6* 04/06/2015 1610  HCT 39.5 04/06/2015 0626   PLT 82* 04/06/2015 0626   MCV 102.3* 04/06/2015 0626   NEUTROABS 5.8 04/04/2015 1632   LYMPHSABS 0.7 04/04/2015 1632   MONOABS 0.7 04/04/2015 1632   EOSABS 0.1 04/04/2015 1632   BASOSABS 0.0 04/04/2015 1632   Comprehensive Metabolic Panel:    Component Value Date/Time   NA 139 04/06/2015 0626   K 4.3 04/06/2015 0626   CL 105 04/06/2015 0626   CO2 25 04/06/2015 0626   BUN 93* 04/06/2015 0626   CREATININE 2.99* 04/06/2015 0626   GLUCOSE 117* 04/06/2015 0626   CALCIUM 8.0* 04/06/2015 0626   AST 67* 04/06/2015 0626   ALT 39 04/06/2015 0626   ALKPHOS 89 04/06/2015 0626   BILITOT 1.5* 04/06/2015 0626   PROT 6.1* 04/06/2015 0626   ALBUMIN 2.8* 04/06/2015 0626    Physical Exam: Vital Signs: BP 105/54 mmHg  Pulse 47  Temp(Src) 97.5 F (36.4 C) (Oral)  Resp 12  Ht  (1.651 m)  Wt 61.735 kg (136 lb 1.6 oz)  BMI 22.65 kg/m2  SpO2 98% SpO2: SpO2: 98 % O2 Device: O2 Device: Not Delivered O2 Flow Rate:   Intake/output summary:  Intake/Output Summary (Last 24 hours) at 04/06/15 1542 Last data filed at 04/06/15 1221  Gross per 24 hour  Intake   2285 ml  Output    550 ml  Net   1735 ml   LBM: Last BM Date: 04/05/15 Baseline Weight: Weight: 58.968 kg (130 lb) Most recent weight: Weight: 61.735 kg (136 lb 1.6 oz)  Exam Findings:  GEN: Somewhat  frail looking, sitting in gerichair, in NAD, makes and keeps eye contact, looks tired at times.  RESP:  Even and non labored ABD: soft rounded MUSC:  Generalized weakness         Palliative Performance Scale:              2 months ago:60 % 1 month ago: 50% Today: 30%    Additional Data Reviewed: Recent Labs     04/05/15  0231  04/06/15  0626  WBC  6.2  6.8  HGB  12.1*  12.6*  PLT  60*  82*  NA  138  139  BUN  85*  93*  CREATININE  3.00*  2.99*     Time In: 1400 Time Out: 1600 Time Total: 120 minutes Greater than 50%  of this time was spent counseling and coordinating care related to the above assessment and plan.  Discussion and goals of care shared with SW.  Thank you for consulting the PMT.   Signed by: Katheran Awe, NP  Katheran Awe, NP  04/06/2015, 3:42 PM  Please contact Palliative Medicine Team phone at [phone] for questions and concerns.

## 2015-04-06 NOTE — Progress Notes (Signed)
[facility] PROGRESS NOTE  Calvin SOBIECK LSL:[id number] DOB: [birth year] DOA: 04/04/2015 PCP: Glori Bickers, MD  Brief Summary  Calvin Rivas is a 81 y.o. Male who lives at home alone who presented with gradual onset of weakness with recurrent falls, particularly in the 2 weeks prior to admission.  The night prior to admission, he was at the church cleaning and fell and was unable to get up until he was found by church patrons the following morning.  He has been hypotensive with oliguric AKI and demand ischemia.  He is full code but previous MD had recommended DNR and he is currently still on floor status on midodrine with clinical improvement.    Assessment/Plan  Acute on CKD stage 3: suspect ARF from pre-renal azotemia and Rhabdomyolysis.  Creatinine stable, BUN continues to rise -  D/c IVF -  Continue to hold all antihypertensives -  Appreciate nephrology assistance -  FENa and RUS pending  Hypotension, improving slightly.  Suspect that this is multifactorial from probable medications and underlying chronic systolic CHF.  -  Remains afebrile -  Continue midodrine -  Palliative care consultation pending  Sinus Bradycardia:suspect secondary to excessive use of Metoprolol. -  Appreciate cardiology assistance -  Hold all anti-hypertensives -  TSH  4.254   Acute on Chronic Systolic CHF:  EF 20-25% 11/2013 ECHO. Has leg edema/few bibasilar rales.  -  Stop IVF  -  resume Lasix 40mg  po daily and monitor BP closely -  Per cardiology, not a candidate for PPM -  No ACEI or BB due to bradycardia and hypotension  Rhabdomyolysis: secondary to prolonged immobilization, CK trending down -  D/c IVF  Generalized weakness: probably secondary to above noted issues.  -  PT/OT eval and likely SNF placement  Mechanical fall:  occured on Saturday.  Denies syncope. Likely secondary to hypotension from incorrectly taking medications.  See above  Thrombocytopenia:  seems to be a chronic  issue and improving  Chronic Lower Ext edema: uses Unna boots to control edema.  -  Replace Unna boot when able  Social Issues:  lives alone, question whether patient is taking excessive medications inadvertantly, frequent falls-suspect will need SNF on discharge.  -  Social work consult  Moderate protein calorie malnutrition -  Appreciate nutrition assistance -  Continue ensure  Diet:  Low sodium Access:  PIV IVF:  off Proph:  heparin  Code Status: full Family Communication: patient alone and offered to speak to niece by phone Disposition Plan: pending further improvement in blood pressure.  If he continues to improve, probable transition to skilled nursing facility sometime next week.  Consultants:  Cardiology  Nephrology  Procedures:  X-ray hips on 6/27: Indeterminate age of an inferior pubic ramus fracture on the left, degenerative narrowing of bilateral hips  Chest x-ray: Cardiomegaly with increased interstitial densities and pulmonary edema, left pleural effusion which is stable from prior  CT head on 6/27: Mild diffuse atrophy  Antibiotics:  None   HPI/Subjective:  States that he feels better. He states he is making more water. Denies lightheadedness, dizziness, chest pain, shortness of breath, increased cough.    Objective: Filed Vitals:   04/05/15 1451 04/05/15 1500 04/05/15 2006 04/06/15 0614  BP: 72/35 75/35 82/41  105/54  Pulse: 47 50 50 51  Temp: 98.1 F (36.7 C)  97.6 F (36.4 C) 97.5 F (36.4 C)  TempSrc:   Oral Oral  Resp:   12 12  Height:      Weight:  61.735 kg (136 lb 1.6 oz)  SpO2: 96%  94% 96%    Intake/Output Summary (Last 24 hours) at 04/06/15 0753 Last data filed at 04/06/15 0618  Gross per 24 hour  Intake   2365 ml  Output    350 ml  Net   2015 ml   Filed Weights   04/04/15 1545 04/04/15 2145 04/06/15 0614  Weight: 58.968 kg (130 lb) 57.788 kg (127 lb 6.4 oz) 61.735 kg (136 lb 1.6 oz)    Exam:   General:  Thin male,  No acute distress, rales and rhonchi audible without stethoscope  HEENT:  NCAT, MMM  Cardiovascular:  Bradycardic RR, 2+ pulses, warm extremities, telemetry: Normal sinus rhythm with rate in the 40s  Respiratory:  Rales to the mid back bilaterally with rhonchi, no wheezes, no increased WOB  Abdomen:   NABS, soft, NT/ND  MSK:   Normal tone and bulk, 2+ pitting bilateral LEE  Neuro:  Diffusely weak and needed assistance to sit up in bed, mildly slurred speech but understandable  Psych: Alert and aware of the plan of care. Was able to recall what his creatinine was yesterday.  Data Reviewed: Basic Metabolic Panel:  Recent Labs Lab 04/04/15 1632 04/05/15 0231 04/06/15 0626  NA 137 138 139  K 4.0 4.3 4.3  CL 101 104 105  CO2 GLUCOSE 126* 130* 117*  BUN 87* 85* 93*  CREATININE 2.93* 3.00* 2.99*  CALCIUM 8.7* 8.3* 8.0*   Liver Function Tests:  Recent Labs Lab 04/05/15 0231 04/06/15 0626  AST 91* 67*  ALT 40 39  ALKPHOS 87 89  BILITOT 1.2 1.5*  PROT 6.0* 6.1*  ALBUMIN 2.8* 2.8*   No results for input(s): LIPASE, AMYLASE in the last 168 hours. No results for input(s): AMMONIA in the last 168 hours. CBC:  Recent Labs Lab 04/04/15 1632 04/05/15 0231 04/06/15 0626  WBC 7.3 6.2 6.8  NEUTROABS 5.8  --   --   HGB 12.4* 12.1* 12.6*  HCT 37.7* 37.5* 39.5  MCV 101.6* 101.4* 102.3*  PLT 64* 60* 82*   Cardiac Enzymes:  Recent Labs Lab 04/04/15 1632 04/04/15 2010 04/05/15 0231 04/05/15 0822 04/05/15 1004 04/06/15 0626  CKTOTAL 1525*  --   --   --  583* 338  TROPONINI 0.31* 0.31* 0.25* 0.24*  --   --    BNP (last 3 results)  Recent Labs  04/04/15 1632  BNP 2864.0*    ProBNP (last 3 results) No results for input(s): PROBNP in the last 8760 hours.  CBG: No results for input(s): GLUCAP in the last 168 hours.  Recent Results (from the past 240 hour(s))  Urine culture     Status: None (Preliminary result)   Collection Time: 04/04/15  5:30 PM   Result Value Ref Range Status   Specimen Description URINE, CLEAN CATCH  Final   Special Requests NONE  Final   Culture   Final    CULTURE REINCUBATED FOR BETTER GROWTH Performed at [facility]    Report Status PENDING  Incomplete     Studies: Dg Chest 1 View  04/04/2015   CLINICAL DATA:  Calvin Rivas from wheelchair at church 2 days ago. Initial encounter.  EXAM: CHEST  1 VIEW  COMPARISON:  November 26, 2013.  FINDINGS: Stable cardiomegaly. No pneumothorax is noted. Increased interstitial densities are noted throughout both lungs concerning for vascular congestion and pulmonary edema. Probable left pleural effusion is noted. Bony thorax is intact. Multilevel degenerative disc  disease is noted in thoracic spine.  IMPRESSION: Cardiomegaly is noted with increased interstitial densities throughout both lungs concerning for pulmonary edema and possible congestive heart failure. Mild left pleural effusion is noted which appears to be increased compared to prior exam.   Electronically Signed   By: Lupita Raider, M.D.   On: 04/04/2015 17:19   Ct Head Wo Contrast  04/04/2015   CLINICAL DATA:  Weakness after fall from wheelchair 2 days ago.  EXAM: CT HEAD WITHOUT CONTRAST  TECHNIQUE: Contiguous axial images were obtained from the base of the skull through the vertex without intravenous contrast.  COMPARISON:  CT scan of August 24, 2013.  FINDINGS: Bony calvarium appears intact. Mild diffuse cortical atrophy is noted. Mild chronic ischemic white matter disease is noted. No mass effect or midline shift is noted. Ventricular size is within normal limits. There is no evidence of mass lesion, hemorrhage or acute infarction. 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  IMPRESSION: Mild diffuse cortical atrophy. Mild chronic ischemic white matter disease. No acute intracranial abnormality seen.   Electronically Signed   By: Lupita Raider, M.D.   On: 04/04/2015 17:34   Dg Hips Bilat With Pelvis  2v  04/04/2015   CLINICAL DATA:  Status post fall striking the buttocks on Saturday ; patient not found until Sunday ; patient reports no pain but is week  EXAM: BILATERAL HIP (WITH PELVIS) 2 VIEWS  COMPARISON:  None.  FINDINGS: The bony pelvis is osteopenic. There is deformity of the inferior pubic ramus on the left that is of uncertain age. The observed portions of the sacrum exhibit no acute abnormalities. The SI joints are unremarkable. There is symmetric narrowing of the hip joint spaces bilaterally. The femoral heads, necks, and intertrochanteric regions are normal.  IMPRESSION: Deformity of the inferior pubic ramus on the left that is of uncertain age. No acute fracture is demonstrated elsewhere. There is degenerative narrowing of the hip joint spaces bilaterally.  If there are strong clinical concerns that the left inferior pubic ramus abnormality is acute or that there is occult pathology elsewhere, pelvic MRI or CT scanning would be useful.   Electronically Signed   By: David  [place] M.D.   On: 04/04/2015 17:22    Scheduled Meds: . feeding supplement (ENSURE ENLIVE)  237 mL Oral BID BM  . heparin  5,000 Units Subcutaneous 3 times per day  . midodrine  5 mg Oral TID WC  . sodium chloride  3 mL Intravenous Q12H   Continuous Infusions: . sodium chloride 100 mL/hr at 04/06/15 0022    Active Problems:   Acute renal failure   Cardiovascular renal disease   AKI (acute kidney injury)   Rhabdomyolysis   Physical deconditioning   Acute systolic congestive heart failure   HLD (hyperlipidemia)   Venous stasis dermatitis   Falls   Generalized weakness   Bradycardia   Malnutrition of moderate degree    Time spent: 30 min    Jeryl Wilbourn  [facility] Pager [phone]. If 7PM-7AM, please contact night-coverage at [email or web address], password [facility] 04/06/2015, 7:53 AM  LOS: 2 days

## 2015-04-06 NOTE — Progress Notes (Signed)
OT Cancellation Note  Patient Details Name: Calvin Rivas MRN: [id number] DOB: 01-19-1921   Cancelled Treatment:     Reason evaluation not completed: Pt not medically ready. Pt vitals signs are improving however continue to be unstable for evaluation. Will continue to check on patient and will evaluate when appropriate.   Ezra Sites, OTR/L  [phone]  04/06/2015, 9:28 AM

## 2015-04-06 NOTE — Evaluation (Signed)
Physical Therapy Evaluation Patient Details Name: DEVEION BARTLES MRN: [id number] DOB: June 12, 1921 Today's Date: 04/06/2015   History of Present Illness  Calvin Rivas is a 81 y.o. Male who lives at home alone who presented with gradual onset of weakness with recurrent falls, particularly in the 2 weeks prior to admission. The night prior to admission, he was at the church cleaning and fell and was unable to get up until he was found by church patrons the following morning. He has been hypotensive with oliguric AKI and demand ischemia. He is full code but previous MD had recommended DNR and he is currently still on floor status on midodrine with clinical improvement.   Clinical Impression  Pt is received semirecumbent in bed upon entry, awake, alert, and willing to participate. No acute distress noted. Pt is A&Ox3, pleasant, and chatty. Pt reports rich falls history over past 3 years, some of which have resulted in serious injury. Most recent fall was in Nov 2015, and of course one fall associated c this admission. Pt strength as with functional mobility assessment is generally weak throughout, limiting indep in bed mobility, transfers, and ambulation. Pt remains bradycardic at baseline, and is being followed by cardiology to review potential for PPM. Pt has appropriate response to activity in HR. Pt has also been hypotensive this visit, but denies any dizziness or lightheadedness during evaluation. At eval, all mobility is severely limited by pain or weakness, and pt remains reluctant to acknowledge these functional changes, which raises serious concern for DC to home in current state. Patient presenting with impairment of strength, range of motion, balance, and activity tolerance, limiting ability to perform ADL and mobility tasks at  baseline level of function. Patient will benefit from skilled intervention to address the above impairments and limitations, in order to restore to prior level of  function, improve patient safety upon discharge, and to decrease falls risk.       Follow Up Recommendations SNF    Equipment Recommendations  Rolling walker with 5" wheels    Recommendations for Other Services       Precautions / Restrictions Precautions Precautions: Fall Precaution Comments: Hx of falls Restrictions Weight Bearing Restrictions: No      Mobility  Bed Mobility Overal bed mobility: Needs Assistance Bed Mobility: Supine to Sit     Supine to sit: Max assist     General bed mobility comments: unable to pull self up c BUE support.   Transfers Overall transfer level: Needs assistance Equipment used: Straight cane Transfers: Sit to/from [employer] Sit to Stand: Min assist Stand pivot transfers: Max assist (MaxA for weight shift required for pt to perform. )       General transfer comment: multiple attempts, poor ability to perform forward trunk lean over feet.   Ambulation/Gait             General Gait Details: Unable to perform, attempting to raise feet c weight shirt, but only the heel leaves the floor bilat. Pt appears quite unsteady, SPC in two-hand grasp.   Stairs            Wheelchair Mobility    Modified Rankin (Stroke Patients Only)       Balance Overall balance assessment: Needs assistance Sitting-balance support: Bilateral upper extremity supported Sitting balance-Leahy Scale: Fair Sitting balance - Comments: postural deviations and trunk weakness creating difficult stability scenarios.  Postural control: Posterior lean;Right lateral lean Standing balance support: Single extremity supported Standing balance-Leahy Scale: Fair  Pertinent Vitals/Pain Pain Assessment: No/denies pain Pain Location: but later reports pain in toe c standing, bad enough to severely decrease motivation to perform ambulation; reports upon moving that his butt hurts.  Pain Intervention(s):  Monitored during session    Home Living Family/patient expects to be discharged to:: Private residence Living Arrangements: Alone Available Help at Discharge: Friend(s) (Has a friend who helps c groceries; unable to determine availability for additional help. \) Type of Home: House Home Access: Stairs to enter Entrance Stairs-Rails: Right Entrance Stairs-Number of Steps: 5 back, 2 front Home Layout: Able to live on main level with bedroom/bathroom Home Equipment: Cane - single point;Walker - 2 wheels Additional Comments: Reports using RW in house, and SPC outside of home.     Prior Function Level of Independence: Independent with assistive device(s)         Comments: Previously driving, cooking, bathing, cleaning, ambulating indep, with assistance c groceries from a friend. has no immediate damily locally, but some in [place].      Hand Dominance   Dominant Hand: Right    Extremity/Trunk Assessment   Upper Extremity Assessment: Generalized weakness           Lower Extremity Assessment: Generalized weakness      Cervical / Trunk Assessment: Kyphotic (generally weak)  Communication   Communication: No difficulties (mild dysarthria. )  Cognition Arousal/Alertness: Awake/alert Behavior During Therapy: WFL for tasks assessed/performed Overall Cognitive Status: Within Functional Limits for tasks assessed                      General Comments      Exercises        Assessment/Plan    PT Assessment Patient needs continued PT services  PT Diagnosis Difficulty walking;Abnormality of gait;Generalized weakness;Acute pain   PT Problem List Decreased strength;Decreased safety awareness;Decreased activity tolerance;Decreased skin integrity;Decreased balance;Pain;Decreased mobility;Cardiopulmonary status limiting activity  PT Treatment Interventions DME instruction;Balance training;Gait training;Neuromuscular re-education;Stair training;Functional mobility  training;Therapeutic activities;Therapeutic exercise   PT Goals (Current goals can be found in the Care Plan section) Acute Rehab PT Goals Patient Stated Goal: Return to home, regain strength  PT Goal Formulation: With patient Time For Goal Achievement: 04/20/15 Potential to Achieve Goals: Fair    Frequency Min 3X/week   Barriers to discharge Inaccessible home environment;Decreased caregiver support;Other (comment) (limited awerness of limitations. )      Co-evaluation               End of Session Equipment Utilized During Treatment: Gait belt Activity Tolerance: Patient limited by fatigue;Patient limited by pain Patient left: in chair;with family/visitor present;with call bell/phone within reach;with chair alarm set Nurse Communication: Other (comment)         Time: 1610-9604 PT Time Calculation (min) (ACUTE ONLY): 28 min   Charges:   PT Evaluation $Initial PT Evaluation Tier I: 1 Procedure     PT G Codes:        Alexxus Sobh C 04-24-2015, 12:13 PM  12:19 PM  Rosamaria Lints, PT, DPT [place] License # [id number]

## 2015-04-06 NOTE — Clinical Social Work Note (Signed)
Clinical Social Work Assessment  Patient Details  Name: Calvin Rivas MRN: [id number] Date of Birth: 1921-07-25  Date of referral:  04/06/15               Reason for consult:  Facility Placement                Permission sought to share information with:  Family Supports Permission granted to share information::  Yes, Verbal Permission Granted  Name::     Chartered certified accountant::     Relationship::  niece  Contact Information:     Housing/Transportation Living arrangements for the past 2 months:  Single Family Home Source of Information:  Patient, Other (Comment Required) (niece, sister) Patient Interpreter Needed:  None Criminal Activity/Legal Involvement Pertinent to Current Situation/Hospitalization:  No - Comment as needed Significant Relationships:  Other Family Members, Siblings Lives with:  Self Do you feel safe going back to the place where you live?   (Pt did not address this despite questions ) Need for family participation in patient care:  Yes (Comment)  Care giving concerns:  Pt lives alone and does not have much assist at home. His niece has tried to set up someone to come clean his home but he fired them because he felt they would steal from him.    Social Worker assessment / plan:  CSW met with pt and pt's sister and niece at bedside. Pt alert and oriented and reports he lives alone. His niece, Calvin Rivas lives in [place] and his sister lives in [place], [place]. He feels he has been managing fine on his own, but his family has been recommending ALF or home care for some time. Pt fell out of his wheelchair while setting things up at church for the next morning. He laid on the floor there from 6pm until after 9 the next morning before someone found him. Pt has something similar to [employer] alert, but it only works in his home. PT evaluated pt this morning and recommend SNF. CSW tried to discuss this with pt, but he immediately shut down. He states, "I've always made up my own mind about  things." He is aware of Medicare coverage/criteria, but is not at all concerned about finances, just does not want to go. He admits his legs are too weak to walk. CSW discussed how he would manage on his own at home in current condition and shared that he could fall again and break something. Pt states he broke his pride in this fall. CSW provided support and shared pt had right to self-determination. At baseline, pt ambulates with a cane and uses a wheelchair at church. He is still driving although family does not feel this is best. Pt generally picks up food and brings it home. He states he has visited people at SNF and did not approve of the treatment there. Family also shared other people they know that have had a successful time at rehab. Pt requested everyone to step out so he could use the bathroom. CSW discussed briefly with family in hallway that [place] would follow up in AM. Calvin Rivas plans to talk to pt about it further this evening as she feels that she can help pt see need for short term SNF.   Employment status:  Retired Forensic scientist:  Medicare PT Recommendations:  [place] / Referral to community resources:  [place]  Patient/Family's Response to care:  Family very understanding of need for SNF and will continue  to discuss with pt. Pt states he is "stubborn" and will "twist everything to work out my way."   Patient/Family's Understanding of and Emotional Response to Diagnosis, Current Treatment, and Prognosis:  Pt and family report understanding of admission diagnosis. However, pt is very resistant to PT recommendation for SNF, despite hearing from multiple disciplines that this is needed.   Emotional Assessment Appearance:  Appears stated age Attitude/Demeanor/Rapport:  Guarded Affect (typically observed):  Defensive Orientation:  Oriented to Self, Oriented to Place, Oriented to  Time, Oriented to Situation Alcohol / Substance use:  Not  Applicable Psych involvement (Current and /or in the community):  No (Comment)  Discharge Needs  Concerns to be addressed:  Discharge Planning Concerns Readmission within the last 30 days:  No Current discharge risk:  Lives alone Barriers to Discharge:  Continued Medical Work up   [employer], [employer], [place] 04/06/2015, 12:28 PM [phone]

## 2015-04-06 NOTE — Progress Notes (Signed)
BRETTON MAYNE  MRN: [id number]  DOB/AGE: 1921-09-19 81 y.o.  Primary Care Physician:TRIVEDI,RAJENDRA, MD  Admit date: 04/04/2015  Chief Complaint:  Chief Complaint  Patient presents with  . Fall  . Weakness    S-Pt presented on  04/04/2015 with  Chief Complaint  Patient presents with  . Fall  . Weakness  .    Pt today feels better. Pt says " if you can make good into better, that is me"   . feeding supplement (ENSURE ENLIVE)  237 mL Oral BID BM  . furosemide  40 mg Oral Daily  . heparin  5,000 Units Subcutaneous 3 times per day  . midodrine  5 mg Oral TID WC  . sodium chloride  3 mL Intravenous Q12H      Physical Exam: Vital signs in last 24 hours: Temp:  [97.5 F (36.4 C)-98.1 F (36.7 C)] 97.5 F (36.4 C) (06/29 4742) Pulse Rate:  [47-52] 51 (06/29 0614) Resp:  [12] 12 (06/29 0614) BP: (72-105)/(35-54) 105/54 mmHg (06/29 0614) SpO2:  [94 %-96 %] 96 % (06/29 0614) Weight:  [136 lb 1.6 oz (61.735 kg)] 136 lb 1.6 oz (61.735 kg) (06/29 5956) Weight change: 6 lb 1.6 oz (2.767 kg) Last BM Date: 04/05/15  Intake/Output from previous day: 06/28 0701 - 06/29 0700 In: 2365 [P.O.:920; I.V.:1445] Out: 350 [Urine:350] Total I/O In: 360 [P.O.:360] Out: -    Physical Exam: General- pt is awake,alert, follows commands Resp- No acute REsp distress, Decreased bs at bases CVS- S1S2 regular in rate and rhythm GIT- BS+, soft, NT, ND EXT- 1+ LE Edema, No Cyanosis   Lab Results: CBC  Recent Labs  04/05/15 0231 04/06/15 0626  WBC 6.2 6.8  HGB 12.1* 12.6*  HCT 37.5* 39.5  PLT 60* 82*    BMET  Recent Labs  04/05/15 0231 04/06/15 0626  NA 138 139  K 4.3 4.3  CL 104 105  CO2 25 25  GLUCOSE 130* 117*  BUN 85* 93*  CREATININE 3.00* 2.99*  CALCIUM 8.3* 8.0*   Creat trend 2016 2.93=>3.0 2015 1.6--1.7 2014 1.5--1.6    MICRO Recent Results (from the past 240 hour(s))  Urine culture     Status: None (Preliminary result)   Collection Time:  04/04/15  5:30 PM  Result Value Ref Range Status   Specimen Description URINE, CLEAN CATCH  Final   Special Requests NONE  Final   Culture   Final    CULTURE REINCUBATED FOR BETTER GROWTH Performed at [facility]    Report Status PENDING  Incomplete      Lab Results  Component Value Date   PTH 84.7* 08/28/2013   CALCIUM 8.0* 04/06/2015   PHOS 3.2 08/28/2013        Impression: 1)Renal AKI secondary to prerenal/ATN  AKi sec to Hypotension  Hypovolemia  ACE    AKI  at plateau  AKi on CKD  CKD stage 3.  CKD since 2014  CKD secondary to Cardiorenal/ age associated decline  Progression of CKD marked with AKI    2)CVs- hypotensive  On Midodrine  3)Anemia HGb stable  4)CKD Mineral-Bone Disorder PTH acceptable Secondary Hyperparathyroidism present  Phosphorus at goal.   5)CHF- hx of CHF Cardiology and Primary MD following  6)Electrolytes Normokalemic NOrmonatremic   7)Acid base Co2 at goal    Plan:  Agree with adding diuretics      BHUTANI,MANPREET S 04/06/2015, 10:06 AM

## 2015-04-07 DIAGNOSIS — I5023 Acute on chronic systolic (congestive) heart failure: Secondary | ICD-10-CM

## 2015-04-07 LAB — RENAL FUNCTION PANEL
Albumin: 2.6 g/dL — ABNORMAL LOW (ref 3.5–5.0)
Anion gap: 9 (ref 5–15)
BUN: 93 mg/dL — AB (ref 6–20)
CO2: 26 mmol/L (ref 22–32)
Calcium: 8 mg/dL — ABNORMAL LOW (ref 8.9–10.3)
Chloride: 104 mmol/L (ref 101–111)
Creatinine, Ser: 2.81 mg/dL — ABNORMAL HIGH (ref 0.61–1.24)
GFR calc Af Amer: 21 mL/min — ABNORMAL LOW (ref >60)
GFR calc non Af Amer: 18 mL/min — ABNORMAL LOW (ref >60)
Glucose, Bld: 110 mg/dL — ABNORMAL HIGH (ref 65–99)
PHOSPHORUS: 3.2 mg/dL (ref 2.5–4.6)
Potassium: 4.4 mmol/L (ref 3.5–5.1)
SODIUM: 139 mmol/L (ref 135–145)

## 2015-04-07 LAB — CBC
HCT: 40.5 % (ref 39.0–52.0)
HEMOGLOBIN: 13.2 g/dL (ref 13.0–17.0)
MCH: 33 pg (ref 26.0–34.0)
MCHC: 32.6 g/dL (ref 30.0–36.0)
MCV: 101.3 fL — ABNORMAL HIGH (ref 78.0–100.0)
Platelets: 99 10*3/uL — ABNORMAL LOW (ref 150–400)
RBC: 4 MIL/uL — ABNORMAL LOW (ref 4.22–5.81)
RDW: 16.2 % — AB (ref 11.5–15.5)
WBC: 6.6 10*3/uL (ref 4.0–10.5)

## 2015-04-07 MED ORDER — FUROSEMIDE 10 MG/ML IJ SOLN
80.0000 mg | Freq: Two times a day (BID) | INTRAMUSCULAR | Status: DC
  Administered 2015-04-07 (×2): 80 mg via INTRAVENOUS
  Filled 2015-04-07 (×2): qty 8

## 2015-04-07 NOTE — Progress Notes (Signed)
Daily Progress Note   Patient Name: Calvin Rivas       Date: 04/07/2015 DOB: 1921/09/13  Age: 81 y.o. MRN#: [id number] Attending Physician: Renae Fickle, MD Primary Care Physician: Glori Bickers, MD Admit Date: 04/04/2015  Reason for Consultation/Follow-up: Establishing goals of care and Psychosocial/spiritual support  Subjective: Calvin Rivas is sitting his gerichair today looking stronger. He makes and keeps eye contact.  He has is niece, Calvin Rivas at his side.  He states he doesn't want to, but is accepting of his need to go to [facility].  He states his goal is to get back as close to his baseline as possible.  He again today talks about his past, telling stories from his days in the [place] and war time exploits.  We discussed his heart problems, including his EF, medications, and possible need for fluid restrictions.  We discussed concerns for dysphagia d/t Calvin Rivas having drops of liquid fall from his mouth.      Length of Stay: 3 days  Current Medications: Scheduled Meds:  . feeding supplement (ENSURE ENLIVE)  237 mL Oral BID BM  . furosemide  80 mg Intravenous BID  . heparin  5,000 Units Subcutaneous 3 times per day  . midodrine  5 mg Oral TID WC  . sodium chloride  3 mL Intravenous Q12H    Continuous Infusions:    PRN Meds: acetaminophen **OR** acetaminophen, ondansetron **OR** ondansetron (ZOFRAN) IV, oxyCODONE  Palliative Performance Scale:    Today: 50%    Vital Signs: BP 98/58 mmHg  Pulse 57  Temp(Src) 97.8 F (36.6 C) (Oral)  Resp 18  Ht 5\' 5"  (1.651 m)  Wt 61.825 kg (136 lb 4.8 oz)  BMI 22.68 kg/m2  SpO2 94% SpO2: SpO2: 94 % O2 Device: O2 Device: Not Delivered O2 Flow Rate:    Intake/output summary:  Intake/Output Summary (Last 24 hours) at 04/07/15 1550 Last data filed at 04/07/15 1443  Gross per 24 hour  Intake    723 ml  Output   1000 ml  Net   -277 ml   LBM:   Baseline Weight: Weight: 58.968 kg (130 lb) Most recent weight:  Weight: 61.825 kg (136 lb 4.8 oz)  Physical Exam: GEN: sitting up in gerichair, NAD Resp: even and non labored ABD:  Soft non tender         Musc:  Continued generalized weakness  Additional Data Reviewed: Recent Labs     04/06/15  0626  04/07/15  0657  WBC  6.8  6.6  HGB  12.6*  13.2  PLT  82*  99*  NA  139  139  BUN  93*  93*  CREATININE  2.99*  2.81*     Problem List:  Patient Active Problem List   Diagnosis Date Noted  . Acute on chronic systolic congestive heart failure   . Palliative care encounter 04/06/2015  . DNR (do not resuscitate) discussion   . Malnutrition of moderate degree 04/05/2015  . Acute renal failure 04/04/2015  . 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 AKI (acute kidney injury) 04/04/2015  . Rhabdomyolysis 04/04/2015  . Physical deconditioning 04/04/2015  . Acute systolic congestive heart failure 04/04/2015  . HLD (hyperlipidemia) 04/04/2015  . Venous stasis dermatitis 04/04/2015  . Falls 04/04/2015  . Generalized weakness 04/04/2015  . Bradycardia 04/04/2015  . Elevated troponin   . Cardiomyopathy 11/28/2013  . Systolic and diastolic CHF, acute 11/28/2013  . Acute CHF (congestive heart failure) 11/26/2013  . CKD (chronic  kidney disease), stage III 11/26/2013  . Hypernatremia 11/26/2013  . Thrombocytopenia, unspecified 11/26/2013  . Pedal edema 11/26/2013  . Fall 08/25/2013  . Multiple fractures of ribs of left side 08/25/2013  . Traumatic subarachnoid hemorrhage 08/25/2013  . Traumatic intraventricular hemorrhage, grade I 08/25/2013  . Acute blood loss anemia 08/25/2013  . Acute kidney injury 08/25/2013  . Hypertension   . Hypercholesterolemia   . Reflux      Palliative Care Assessment & Plan    Code Status:  DNR   [place] form completed today.   We discussed end of life choices today and Calvin Rivas states that he would not want CPR, intubation, or defibrillation if his heart were to stop naturally.    HCPOA:  Mr.  Rivas states he does not want his sister Calvin Rivas making his medical decisions, but he is not ready to name HCPOA/complete this paperwork.   Goals of Care:  Calvin Rivas talks again about his goals to return to as close to baseline as possible.  He would like to return home living independently.  He states he likes to talk with others and learn, and that he would not want to live bed bound. He would still like to go looking/shopping for antiques.   Prognosis: Unable to determine  Discharge Planning: Skilled Nursing Facility for rehab with Palliative care service follow-up   Additional information:  Chronic illness trajectory shared with niece, Calvin Rivas. Encouraged her to record Calvin Rivas's stories if he wishes.  We discussed 'Do not re hospitalize and the concept of not treating future infections if Calvin Rivas has what he has deemed to be poor quality of life.  Calvin Rivas also voices her concern over Calvin Rivas's safety in driving. Encouraged her to work with the [facility] for driving testing to validate safety.    Care plan was discussed with 'Calvin Rivas and his niece, Calvin Rivas.  Plan also discussed with Nursing and SW.   Thank you for allowing the Palliative Medicine Team to assist in the care of this patient.   Time In: 1500 Time Out: 1600 Total Time 60 minutes Prolonged Time Billed No    Greater than 50%  of this time was spent counseling and coordinating care related to the above assessment and plan.   Katheran Awe, NP  04/07/2015, 3:50 PM  Please contact Palliative Medicine Team phone at [phone] for questions and concerns.

## 2015-04-07 NOTE — Progress Notes (Addendum)
Subjective: Patient offers no complaint. No difficulty in breathing. His appetite is good   Objective: Vital signs in last 24 hours: Temp:  [97.5 F (36.4 C)-97.9 F (36.6 C)] 97.5 F (36.4 C) (06/30 0536) Pulse Rate:  [47-60] 60 (06/30 0536) Resp:  [16-19] 16 (06/30 0536) BP: (92-118)/(44-51) 118/51 mmHg (06/30 0536) SpO2:  [94 %-98 %] 96 % (06/30 0536) Weight:  [136 lb 4.8 oz (61.825 kg)] 136 lb 4.8 oz (61.825 kg) (06/30 0536)  Intake/Output from previous day: 06/29 0701 - 06/30 0700 In: 840 [P.O.:840] Out: 900 [Urine:900] Intake/Output this shift:     Recent Labs  04/04/15 1632 04/05/15 0231 04/06/15 0626 04/07/15 0657  HGB 12.4* 12.1* 12.6* 13.2    Recent Labs  04/06/15 0626 04/07/15 0657  WBC 6.8 6.6  RBC 3.86* 4.00*  HCT 39.5 40.5  PLT 82* 99*    Recent Labs  04/06/15 0626 04/07/15 0657  NA 139 139  K 4.3 4.4  CL 105 104  CO2 25 26  BUN 93* 93*  CREATININE 2.99* 2.81*  GLUCOSE 117* 110*  CALCIUM 8.0* 8.0*   No results for input(s): LABPT, INR in the last 72 hours.  Generally patient is alert and in no apparent distress Chest: Decreased breath sound no rales or rhonchi His heart exam revealed regular rate and rhythm no murmur Extremities he has 2+ edema.  Assessment/Plan: Problem #1 acute kidney injury possibly ATN/ACE inhibitor. His pending creatinine slightly better. Problem #2 chronic renal failure stage III. Presently he doesn't have any nausea or vomiting. Problem #3 hypotension: His blood pressure is much better Problem #4 CHF. Patient with cardiomyopathy with ejection fraction of 20-25%. Presently has significant edema patient is asymptomatic. Patient had about 900 mL of urine output. Problem #5 Metabolic bone disease: His calcium is range Problem #6 thrombocytopenia Plan: change lasix to 80 mg iv bid BMP in am   [facility] S 04/07/2015, 7:51 AM

## 2015-04-07 NOTE — Clinical Social Work Placement (Signed)
   CLINICAL SOCIAL WORK PLACEMENT  NOTE  Date:  04/07/2015  Patient Details  Name: CHARIS CUOZZO MRN: [id number] Date of Birth: [birth year]  Clinical Social Work is seeking post-discharge placement for this patient at the Skilled  Nursing Facility level of care (*CSW will initial, date and re-position this form in  chart as items are completed):      Patient/family provided with [facility] Clinical Social Work Department's list of facilities offering this level of care within the geographic area requested by the patient (or if unable, by the patient's family).  Yes   Patient/family informed of their freedom to choose among providers that offer the needed level of care, that participate in Medicare, Medicaid or managed care program needed by the patient, have an available bed and are willing to accept the patient.  Yes   Patient/family informed of [facility]'s ownership interest in [facility] and [facility], as well as of the fact that they are under no obligation to receive care at these facilities.  PASRR submitted to EDS on       PASRR number received on       Existing PASRR number confirmed on       FL2 transmitted to all facilities in geographic area requested by pt/family on 04/07/15     FL2 transmitted to all facilities within larger geographic area on       Patient informed that his/her managed care company has contracts with or will negotiate with certain facilities, including the following:        Yes   Patient/family informed of bed offers received.  Patient chooses bed at [facility]     Physician recommends and patient chooses bed at      Patient to be transferred to   on  .  Patient to be transferred to facility by       Patient family notified on   of transfer.  Name of family member notified:        PHYSICIAN       Additional Comment:  No pasarr needed per [place] facilities.   _______________________________________________ Karn Cassis, LCSW 04/07/2015, 10:36 AM [phone]

## 2015-04-07 NOTE — Evaluation (Signed)
Occupational Therapy Evaluation Patient Details Name: Calvin Rivas MRN: [id number] DOB: May 12, 1921 Today's Date: 04/07/2015    History of Present Illness Calvin Rivas is a 81 y.o. Male who lives at home alone who presented with gradual onset of weakness with recurrent falls, particularly in the 2 weeks prior to admission. The night prior to admission, he was at the church cleaning and fell and was unable to get up until he was found by church patrons the following morning. He has been hypotensive with oliguric AKI and demand ischemia. He is full code but previous MD had recommended DNR and he is currently still on floor status on midodrine with clinical improvement.    Clinical Impression   Pt agreeable to participate in OT eval. Based PT eval from yesterday patient has greatly improved with functional mobility and transfers. Pt continues to present with generalized weakness, decreased ADL performance, and functional transfers. Recommend skilled OT services to increase the above deficits. Recommend discharge to SNF.    Follow Up Recommendations  SNF    Equipment Recommendations  None recommended by OT       Precautions / Restrictions Precautions Precautions: Fall Precaution Comments: Hx of falls Restrictions Weight Bearing Restrictions: No      Mobility Bed Mobility Overal bed mobility: Needs Assistance Bed Mobility: Supine to Sit     Supine to sit: Modified independent (Device/Increase time)        Transfers Overall transfer level: Needs assistance Equipment used: None Transfers: Sit to/from [employer] Sit to Stand: Min assist Stand pivot transfers: Min assist       General transfer comment: increased time needed to complete.          ADL Overall ADL's : Needs assistance/impaired                     Lower Body Dressing: Total assistance   Toilet Transfer: Minimal assistance;Squat-pivot Toilet Transfer Details (indicate  cue type and reason): to recliner                           Pertinent Vitals/Pain Pain Assessment: No/denies pain     Hand Dominance Right   Extremity/Trunk Assessment Upper Extremity Assessment Upper Extremity Assessment: Generalized weakness   Lower Extremity Assessment Lower Extremity Assessment: Defer to PT evaluation       Communication Communication Communication: No difficulties   Cognition Arousal/Alertness: Awake/alert Behavior During Therapy: WFL for tasks assessed/performed Overall Cognitive Status: Within Functional Limits for tasks assessed                                Home Living   Living Arrangements: Alone Available Help at Discharge: Friend(s) Type of Home: House Home Access: Stairs to enter [employer] of Steps: 5 back, 2 front Entrance Stairs-Rails: Right Home Layout: Able to live on main level with bedroom/bathroom               Home Equipment: Cane - single point;Walker - 2 wheels   Additional Comments: Reports using RW in house, and SPC outside of home.       Prior Functioning/Environment Level of Independence: Independent with assistive device(s)        Comments: Previously driving, cooking, bathing, cleaning, ambulating indep, with assistance c groceries from a friend. has no immediate damily locally, but some in [place].     OT Diagnosis: Generalized  weakness   OT Problem List: Decreased strength   OT Treatment/Interventions: Self-care/ADL training;Therapeutic activities;Therapeutic exercise;Patient/family education;DME and/or AE instruction    OT Goals(Current goals can be found in the care plan section) Acute Rehab OT Goals Patient Stated Goal: Return to home, regain strength  OT Goal Formulation: With patient Time For Goal Achievement: 04/21/15 Potential to Achieve Goals: Good ADL Goals Pt Will Perform Lower Body Dressing: with mod assist;sitting/lateral leans;sit to/from stand Pt Will  Transfer to Toilet: with supervision;ambulating;bedside commode Pt Will Perform Toileting - Clothing Manipulation and hygiene: with supervision;sitting/lateral leans;sit to/from stand Additional ADL Goal #1: Patient will increase BUE strength and endurance to increase functional performance during ADL tasks.  OT Frequency: Min 2X/week   Barriers to D/C: Decreased caregiver support             End of Session Equipment Utilized During Treatment: Gait belt  Activity Tolerance: Patient tolerated treatment well Patient left: in chair;with call bell/phone within reach;with chair alarm set   Time: [phone] OT Time Calculation (min): 40 min Charges:  OT General Charges $OT Visit: 1 Procedure OT Evaluation $Initial OT Evaluation Tier I: 1 Procedure G-Codes:    Limmie Patricia, OTR/L,CBIS  [phone]  04/07/2015, 9:21 AM

## 2015-04-07 NOTE — Clinical Social Work Note (Signed)
CSW met with pt again this morning and he was very realistic about his limitations. Pt reports he does not want to go, but understands the benefit of rehab before returning home. Pt agreeable to [place] only. Facility can offer bed. Pt and niece relieved. CSW will continue to follow.   Benay Pike, [place]

## 2015-04-07 NOTE — Progress Notes (Signed)
[facility] PROGRESS NOTE  [name]:[id number] DOB: 1921-04-30 DOA: 04/04/2015 PCP: Glori Bickers, MD  Brief Summary  Calvin Rivas is a 81 y.o. Male who lives at home alone who presented with gradual onset of weakness with recurrent falls, particularly in the 2 weeks prior to admission.  The night prior to admission, he was at the church cleaning and fell and was unable to get up until he was found by church patrons the following morning.  He has been hypotensive with oliguric AKI and demand ischemia.  He is full code but previous MD had recommended DNR and he is currently still on floor status on midodrine with clinical improvement.  Creatinine and uop are improving as is blood pressure.  Appreciate palliative care assistance with GOC.    Assessment/Plan  Acute on CKD stage 3: suspect ARF from pre-renal azotemia and Rhabdomyolysis.  Creatinine stable, BUN continues to rise -  Continue to hold all antihypertensives -  Appreciate nephrology assistance -  FENa not drawn -  RUS with medical renal disease  Hypotension, improved slightly.  Suspect that this is multifactorial from probable medications and underlying chronic systolic CHF.  -  Remains afebrile -  Continue midodrine  TID -  Palliative care consultation pending  Sinus Bradycardia:suspect secondary to excessive use of Metoprolol. -  Appreciate cardiology assistance -  Hold all anti-hypertensives -  TSH  4.254   Acute on Chronic Systolic CHF:  EF 20-25% 11/2013 ECHO. Stable leg edema but fewer rales  -  Increase lasix to  IV BID and monitor BP carefully -  Per cardiology, not a candidate for PPM -  No ACEI or BB due to bradycardia and hypotension  Rhabdomyolysis: secondary to prolonged immobilization, CK trended down  Generalized weakness: probably secondary to above noted issues.  -  PT/OT eval and likely SNF placement  Mechanical fall:  occured on Saturday.  Denies syncope. Likely secondary to  hypotension from incorrectly taking medications.  See above  Thrombocytopenia:  seems to be a chronic issue and improving  Chronic Lower Ext edema: uses Unna boots to control edema.  -  Replace Unna boot when able  Social Issues:  lives alone, question whether patient is taking excessive medications inadvertantly, frequent falls-suspect will need SNF on discharge.  -  Social work consult appreciated  Moderate protein calorie malnutrition -  Appreciate nutrition assistance -  Continue ensure  Diet:  Low sodium Access:  PIV IVF:  off Proph:  heparin  Code Status: full Family Communication: patient alone and offered to speak to niece by phone Disposition Plan:  Pending further diuresis, stable blood pressure preferably off of midodrine.  Needs to work with PT/OT and probable SNF placement Monday.    Consultants:  Cardiology  Nephrology  Palliative care  Procedures:  X-ray hips on 6/27: Indeterminate age of an inferior pubic ramus fracture on the left, degenerative narrowing of bilateral hips  Chest x-ray: Cardiomegaly with increased interstitial densities and pulmonary edema, left pleural effusion which is stable from prior  CT head on 6/27: Mild diffuse atrophy  Antibiotics:  None   HPI/Subjective:  States that he feels better.  Denies SOB, cough, chest tightness, nausea.  Has heavy and swollen legs and is concerned that he has gained 6-lbs since admission.   Objective: Filed Vitals:   04/06/15 1158 04/06/15 1620 04/06/15 2200 04/07/15 0536  BP:  92/47 113/44 118/51  Pulse: 47 49 49 60  Temp:  97.9 F (36.6 C) 97.6 F (36.4  C) 97.5 F (36.4 C)  TempSrc:   Oral Oral  Resp:  16 19 16   Height:      Weight:    61.825 kg (136 lb 4.8 oz)  SpO2: 98% 97% 94% 96%    Intake/Output Summary (Last 24 hours) at 04/07/15 0755 Last data filed at 04/07/15 0538  Gross per 24 hour  Intake    840 ml  Output    900 ml  Net    -60 ml   Filed Weights   04/04/15 2145  04/06/15 0614 04/07/15 0536  Weight: 57.788 kg (127 lb 6.4 oz) 61.735 kg (136 lb 1.6 oz) 61.825 kg (136 lb 4.8 oz)    Exam:   General:  Thin male, No acute distress  HEENT:  NCAT, MMM  Cardiovascular:  Bradycardic RR, 2+ pulses, warm extremities, telemetry: Normal sinus rhythm with rate in the 40s  Respiratory:   Rales at the bases and diminished at the bases, no wheezes or rhonchi, no increased WOB  Abdomen:   NABS, soft, NT/ND  MSK:   Normal tone and bulk, 2+ pitting bilateral LEE  Neuro:  Diffusely weak and needed assistance to sit up in bed  Psych: Alert and aware of the plan of care, sharp  Data Reviewed: Basic Metabolic Panel:  Recent Labs Lab 04/04/15 1632 04/05/15 0231 04/06/15 0626 04/07/15 0657  NA 137 138 139 139  K 4.0 4.3 4.3 4.4  CL 101 104 105 104  CO2 26 25 25 26   GLUCOSE 126* 130* 117* 110*  BUN 87* 85* 93* 93*  CREATININE 2.93* 3.00* 2.99* 2.81*  CALCIUM 8.7* 8.3* 8.0* 8.0*  PHOS  --   --   --  3.2   Liver Function Tests:  Recent Labs Lab 04/05/15 0231 04/06/15 0626 04/07/15 0657  AST 91* 67*  --   ALT 40 39  --   ALKPHOS 87 89  --   BILITOT 1.2 1.5*  --   PROT 6.0* 6.1*  --   ALBUMIN 2.8* 2.8* 2.6*   No results for input(s): LIPASE, AMYLASE in the last 168 hours. No results for input(s): AMMONIA in the last 168 hours. CBC:  Recent Labs Lab 04/04/15 1632 04/05/15 0231 04/06/15 0626 04/07/15 0657  WBC 7.3 6.2 6.8 6.6  NEUTROABS 5.8  --   --   --   HGB 12.4* 12.1* 12.6* 13.2  HCT 37.7* 37.5* 39.5 40.5  MCV 101.6* 101.4* 102.3* 101.3*  PLT 64* 60* 82* 99*   Cardiac Enzymes:  Recent Labs Lab 04/04/15 1632 04/04/15 2010 04/05/15 0231 04/05/15 0822 04/05/15 1004 04/06/15 0626  CKTOTAL 1525*  --   --   --  583* 338  TROPONINI 0.31* 0.31* 0.25* 0.24*  --   --    BNP (last 3 results)  Recent Labs  04/04/15 1632  BNP 2864.0*    ProBNP (last 3 results) No results for input(s): PROBNP in the last 8760  hours.  CBG: No results for input(s): GLUCAP in the last 168 hours.  Recent Results (from the past 240 hour(s))  Urine culture     Status: None   Collection Time: 04/04/15  5:30 PM  Result Value Ref Range Status   Specimen Description URINE, CLEAN CATCH  Final   Special Requests NONE  Final   Culture   Final    MULTIPLE SPECIES PRESENT, SUGGEST RECOLLECTION IF CLINICALLY INDICATED Performed at [facility]    Report Status 04/06/2015 FINAL  Final     Studies:  US Renal  04/06/2015   CLINICAL DATA:  Acute tubular necrosis.  Hypertension  EXAM: RENAL / URINARY TRACT ULTRASOUND COMPLETE  COMPARISON:  None.  FINDINGS: Right Kidney:  Length: 10.3 cm. There is a cyst within the upper pole measuring 4.6 x 1.7 x 2.0 cm. Similar to previous exam. Increased parenchymal echogenicity. No mass or hydronephrosis visualized.  Left Kidney:  Length: 11 cm. Increased parenchymal echogenicity. No mass or hydronephrosis visualized.  Bladder:  Appears normal for degree of bladder distention.  IMPRESSION: 1. No acute findings. 2. Increased parenchymal echogenicity of the kidneys compatible with medical renal disease.   Electronically Signed   By: Signa Kell M.D.   On: 04/06/2015 09:16    Scheduled Meds: . feeding supplement (ENSURE ENLIVE)  237 mL Oral BID BM  . furosemide  80 mg Intravenous BID  . heparin  5,000 Units Subcutaneous 3 times per day  . midodrine  5 mg Oral TID WC  . sodium chloride  3 mL Intravenous Q12H   Continuous Infusions:    Active Problems:   Acute renal failure   Cardiovascular renal disease   AKI (acute kidney injury)   Rhabdomyolysis   Physical deconditioning   Acute systolic congestive heart failure   HLD (hyperlipidemia)   Venous stasis dermatitis   Falls   Generalized weakness   Bradycardia   Malnutrition of moderate degree   Palliative care encounter   DNR (do not resuscitate) discussion    Time spent: 30 min    Ellinore Merced, [facility]  [facility] Pager [phone]. If 7PM-7AM, please contact night-coverage at [email or web address], password [facility] 04/07/2015, 7:55 AM  LOS: 3 days

## 2015-04-07 NOTE — Progress Notes (Addendum)
PT Cancellation Note  Patient Details Name: Calvin Rivas MRN: [id number] DOB: Feb 18, 1921   Cancelled Treatment:    Reason Eval/Treat Not Completed: Patient at procedure or test/unavailable  Juel Burrow, PTA  Juel Burrow 04/07/2015, 3:14 PM

## 2015-04-07 NOTE — Progress Notes (Signed)
    Primary cardiologist: Dr. Prentice Docker  Following at a distance. Reviewed input from Palliative Care team. Blood pressure has improved in general and heart rate stable, bradycardic but not clearly symptomatic, still no indication for pacemaker. Would keep him off beta blocker. He is on ProAmatine and also IV Lasix, creatinine has come down to 2.8, intake and output were relatively even over the last 24 hours. No changes made at this time.   Jonelle Sidle, M.D., F.A.C.C.

## 2015-04-08 ENCOUNTER — Ambulatory Visit: Payer: Medicare Other | Admitting: Cardiovascular Disease

## 2015-04-08 LAB — CBC
HCT: 42.6 % (ref 39.0–52.0)
Hemoglobin: 14.2 g/dL (ref 13.0–17.0)
MCH: 33.6 pg (ref 26.0–34.0)
MCHC: 33.3 g/dL (ref 30.0–36.0)
MCV: 100.9 fL — AB (ref 78.0–100.0)
PLATELETS: 108 10*3/uL — AB (ref 150–400)
RBC: 4.22 MIL/uL (ref 4.22–5.81)
RDW: 16.3 % — ABNORMAL HIGH (ref 11.5–15.5)
WBC: 7.5 10*3/uL (ref 4.0–10.5)

## 2015-04-08 LAB — RENAL FUNCTION PANEL
Albumin: 2.7 g/dL — ABNORMAL LOW (ref 3.5–5.0)
Anion gap: 11 (ref 5–15)
BUN: 94 mg/dL — AB (ref 6–20)
CHLORIDE: 102 mmol/L (ref 101–111)
CO2: 27 mmol/L (ref 22–32)
Calcium: 8.1 mg/dL — ABNORMAL LOW (ref 8.9–10.3)
Creatinine, Ser: 2.92 mg/dL — ABNORMAL HIGH (ref 0.61–1.24)
GFR calc Af Amer: 20 mL/min — ABNORMAL LOW (ref >60)
GFR calc non Af Amer: 17 mL/min — ABNORMAL LOW (ref >60)
Glucose, Bld: 105 mg/dL — ABNORMAL HIGH (ref 65–99)
Phosphorus: 3.2 mg/dL (ref 2.5–4.6)
Potassium: 4.6 mmol/L (ref 3.5–5.1)
SODIUM: 140 mmol/L (ref 135–145)

## 2015-04-08 LAB — SODIUM, URINE, RANDOM: Sodium, Ur: 112 mmol/L

## 2015-04-08 LAB — CREATININE, URINE, RANDOM: Creatinine, Urine: 21.45 mg/dL

## 2015-04-08 MED ORDER — TORSEMIDE 20 MG PO TABS
40.0000 mg | ORAL_TABLET | Freq: Every day | ORAL | Status: DC
  Administered 2015-04-08 – 2015-04-11 (×4): 40 mg via ORAL
  Filled 2015-04-08 (×6): qty 2

## 2015-04-08 NOTE — Progress Notes (Signed)
Subjective: Patient. Feels better. No nausea or vomiting  But complains of nocturia and could not sleep last night  Objective: Vital signs in last 24 hours: Temp:  [97.8 F (36.6 C)-98.8 F (37.1 C)] 97.8 F (36.6 C) (07/01 0548) Pulse Rate:  [57-78] 78 (07/01 0548) Resp:  [16-18] 16 (07/01 0548) BP: (98-129)/(58-70) 117/70 mmHg (07/01 0548) SpO2:  [94 %-98 %] 98 % (07/01 0548) Weight:  [134 lb 3.2 oz (60.873 kg)] 134 lb 3.2 oz (60.873 kg) (07/01 0548)  Intake/Output from previous day: 06/30 0701 - 07/01 0700 In: 723 [P.O.:720; I.V.:3] Out: 1900 [Urine:1900] Intake/Output this shift:     Recent Labs  04/06/15 0626 04/07/15 0657  HGB 12.6* 13.2    Recent Labs  04/06/15 0626 04/07/15 0657  WBC 6.8 6.6  RBC 3.86* 4.00*  HCT 39.5 40.5  PLT 82* 99*    Recent Labs  04/06/15 0626 04/07/15 0657  NA 139 139  K 4.3 4.4  CL 105 104  CO2 25 26  BUN 93* 93*  CREATININE 2.99* 2.81*  GLUCOSE 117* 110*  CALCIUM 8.0* 8.0*   No results for input(s): LABPT, INR in the last 72 hours.  Generally patient is alert and in no apparent distress Chest: Decreased breath sound no rales or rhonchi His heart exam revealed regular rate and rhythm no murmur Extremities he has 2+ edema.  Assessment/Plan: Problem #1 acute kidney injury possibly ATN/ACE inhibitor. Renal function is improving  Problem #2 chronic renal failure stage III. Presently he doesn't have any nausea or vomiting. Problem #3 hypotension: His blood pressure is much better Problem #4 CHF. Patient with cardiomyopathy with ejection fraction of 20-25%. Still with edema. Presently he is on iv lasix and his urine out put improved and has 1900 cc over the last 24 hours. Denies any difficulty in breathing Problem #5 Metabolic bone  disease: His calcium is range Problem #6 thrombocytopenia Plan: 1]:D/C iv lasix 2] Demadex 40 mg po once aday 3] BMP in am   [facility] S 04/08/2015, 7:43 AM

## 2015-04-08 NOTE — Care Management (Signed)
Important Message  Patient Details  Name: Calvin Rivas MRN: [id number] Date of Birth: Jul 20, 1921   Medicare Important Message Given:  Yes-second notification given    Malcolm Metro, RN 04/08/2015, 9:13 AM

## 2015-04-08 NOTE — Clinical Social Work Note (Signed)
CSW updated [facility] on pt. Aware pt will remain in hospital through weekend per MD. Can accept on holiday, but must be before noon.   Derenda Fennel, LCSW [phone]

## 2015-04-08 NOTE — Progress Notes (Signed)
[facility] PROGRESS NOTE  NORMON RADFORD LZJ:[id number] DOB: [birth year] DOA: 04/04/2015 PCP: Glori Bickers, MD  Brief Summary  Calvin Rivas is a 81 y.o. Male who lives at home alone who presented with gradual onset of weakness with recurrent falls, particularly in the 2 weeks prior to admission.  The night prior to admission, he was at the church cleaning and fell and was unable to get up until he was found by church patrons the following morning.  He has been hypotensive with oliguric AKI and demand ischemia.  He is full code but previous MD had recommended DNR and he is currently still on floor status on midodrine with clinical improvement.  Creatinine and uop are improving as is blood pressure.  Appreciate palliative care assistance with GOC.    Assessment/Plan  Acute on CKD stage 3: suspect ARF from pre-renal azotemia and Rhabdomyolysis.  Labs pending this morning -  Continue to hold all antihypertensives -  Appreciate nephrology assistance -  FENa not drawn -  RUS with medical renal disease  Hypotension, improving slightly.  Suspect that this is multifactorial from probable medications and underlying chronic systolic CHF.  -  Remains afebrile -  D/c midodrine 5mg  TID -  Palliative care consultation appreciated  Sinus Bradycardia:suspect secondary to excessive use of Metoprolol. -  Appreciate cardiology assistance -  Hold all anti-hypertensives -  TSH  4.254   Acute on Chronic Systolic CHF:  EF 20-25% 11/2013 ECHO.  Legs have developed bullae and are still very swollen. -  lasix discontinued.  Nephrology note states to start torsemide, but not ordered.  I have ordered -  Per cardiology, not a candidate for PPM -  No ACEI or BB due to bradycardia and hypotension -  -1.2L but weight only down 1kg  Rhabdomyolysis: secondary to prolonged immobilization, CK trended down  Generalized weakness: probably secondary to above noted issues.  -  PT/OT eval and likely SNF  placement  Mechanical fall:  occured on Saturday.  Denies syncope. Likely secondary to hypotension from incorrectly taking medications.  See above  Thrombocytopenia:  seems to be a chronic issue and improving  Chronic Lower Ext edema: uses Unna boots to control edema.  -  Replace Unna boot when able  Social Issues:  lives alone, question whether patient is taking excessive medications inadvertantly, frequent falls-suspect will need SNF on discharge.  -  Social work consult appreciated  Moderate protein calorie malnutrition -  Appreciate nutrition assistance -  Continue ensure  Diet:  Low sodium Access:  PIV IVF:  off Proph:  heparin  Code Status: full Family Communication: patient alone and offered to speak to niece by phone Disposition Plan:  Pending further diuresis, stable blood pressure preferably off of midodrine.  Needs to work with PT/OT and probable SNF placement Monday.    Consultants:  Cardiology  Nephrology  Palliative care  Procedures:  X-ray hips on 6/27: Indeterminate age of an inferior pubic ramus fracture on the left, degenerative narrowing of bilateral hips  Chest x-ray: Cardiomegaly with increased interstitial densities and pulmonary edema, left pleural effusion which is stable from prior  CT head on 6/27: Mild diffuse atrophy  Antibiotics:  None   HPI/Subjective:  States that he feels better.  Denies SOB, cough, chest tightness, nausea.  Legs feel about the same today Objective: Filed Vitals:   04/07/15 0536 04/07/15 1443 04/07/15 2132 04/08/15 0548  BP: 118/51 98/58 129/67 117/70  Pulse: 60 57 68 78  Temp: 97.5 F (36.4 C)  97.8 F (36.6 C) 98.8 F (37.1 C) 97.8 F (36.6 C)  TempSrc: Oral Oral Oral Oral  Resp: Height:      Weight: 61.825 kg (136 lb 4.8 oz)   60.873 kg (134 lb 3.2 oz)  SpO2: 96% 94%  98%    Intake/Output Summary (Last 24 hours) at 04/08/15 0839 Last data filed at 04/08/15 0810  Gross per 24 hour   Intake    723 ml  Output   2175 ml  Net  -1452 ml   Filed Weights   04/06/15 0614 04/07/15 0536 04/08/15 0548  Weight: 61.735 kg (136 lb 1.6 oz) 61.825 kg (136 lb 4.8 oz) 60.873 kg (134 lb 3.2 oz)    Exam:   General:  Thin male, No acute distress  HEENT:  NCAT, MMM  Cardiovascular:  Bradycardic RR, 2+ pulses, warm extremities, telemetry: Normal sinus rhythm with rate in the 40s  Respiratory:   Rales at the bases and diminished at the bases, no wheezes or rhonchi, no increased WOB  Abdomen:   NABS, soft, NT/ND  MSK:   Normal tone and bulk, 2+ pitting bilateral LEE.  Multiple clear bullae on right lower extremity.  Both legs are slightly warm to touch today but no obvious cellulitis  Neuro:  Diffusely weak and needed assistance to sit up in bed  Psych: Alert and aware of the plan of care, sharp  Data Reviewed: Basic Metabolic Panel:  Recent Labs Lab 04/04/15 1632 04/05/15 0231 04/06/15 0626 04/07/15 0657  NA 137 138 139 139  K 4.0 4.3 4.3 4.4  CL 101 104 105 104  CO2 GLUCOSE 126* 130* 117* 110*  BUN 87* 85* 93* 93*  CREATININE 2.93* 3.00* 2.99* 2.81*  CALCIUM 8.7* 8.3* 8.0* 8.0*  PHOS  --   --   --  3.2   Liver Function Tests:  Recent Labs Lab 04/05/15 0231 04/06/15 0626 04/07/15 0657  AST 91* 67*  --   ALT 40 39  --   ALKPHOS 87 89  --   BILITOT 1.2 1.5*  --   PROT 6.0* 6.1*  --   ALBUMIN 2.8* 2.8* 2.6*   No results for input(s): LIPASE, AMYLASE in the last 168 hours. No results for input(s): AMMONIA in the last 168 hours. CBC:  Recent Labs Lab 04/04/15 1632 04/05/15 0231 04/06/15 0626 04/07/15 0657  WBC 7.3 6.2 6.8 6.6  NEUTROABS 5.8  --   --   --   HGB 12.4* 12.1* 12.6* 13.2  HCT 37.7* 37.5* 39.5 40.5  MCV 101.6* 101.4* 102.3* 101.3*  PLT 64* 60* 82* 99*   Cardiac Enzymes:  Recent Labs Lab 04/04/15 1632 04/04/15 2010 04/05/15 0231 04/05/15 0822 04/05/15 1004 04/06/15 0626  CKTOTAL 1525*  --   --   --  583* 338   TROPONINI 0.31* 0.31* 0.25* 0.24*  --   --    BNP (last 3 results)  Recent Labs  04/04/15 1632  BNP 2864.0*    ProBNP (last 3 results) No results for input(s): PROBNP in the last 8760 hours.  CBG: No results for input(s): GLUCAP in the last 168 hours.  Recent Results (from the past 240 hour(s))  Urine culture     Status: None   Collection Time: 04/04/15  5:30 PM  Result Value Ref Range Status   Specimen Description URINE, CLEAN CATCH  Final   Special Requests NONE  Final   Culture   Final  MULTIPLE SPECIES PRESENT, SUGGEST RECOLLECTION IF CLINICALLY INDICATED Performed at [facility]    Report Status 04/06/2015 FINAL  Final     Studies: US Renal  04/06/2015   CLINICAL DATA:  Acute tubular necrosis.  Hypertension  EXAM: RENAL / URINARY TRACT ULTRASOUND COMPLETE  COMPARISON:  None.  FINDINGS: Right Kidney:  Length: 10.3 cm. There is a cyst within the upper pole measuring 4.6 x 1.7 x 2.0 cm. Similar to previous exam. Increased parenchymal echogenicity. No mass or hydronephrosis visualized.  Left Kidney:  Length: 11 cm. Increased parenchymal echogenicity. No mass or hydronephrosis visualized.  Bladder:  Appears normal for degree of bladder distention.  IMPRESSION: 1. No acute findings. 2. Increased parenchymal echogenicity of the kidneys compatible with medical renal disease.   Electronically Signed   By: Signa Kell M.D.   On: 04/06/2015 09:16    Scheduled Meds: . feeding supplement (ENSURE ENLIVE)  237 mL Oral BID BM  . heparin  5,000 Units Subcutaneous 3 times per day  . midodrine  5 mg Oral TID WC  . sodium chloride  3 mL Intravenous Q12H   Continuous Infusions:    Active Problems:   Acute renal failure   Cardiovascular renal disease   AKI (acute kidney injury)   Rhabdomyolysis   Physical deconditioning   Acute systolic congestive heart failure   HLD (hyperlipidemia)   Venous stasis dermatitis   Falls   Generalized weakness   Bradycardia    Malnutrition of moderate degree   Palliative care encounter   DNR (do not resuscitate) discussion   Acute on chronic systolic congestive heart failure    Time spent: 30 min    Fransico Sciandra, [facility]  [facility] Pager [phone]. If 7PM-7AM, please contact night-coverage at [email or web address], password [facility] 04/08/2015, 8:39 AM  LOS: 4 days

## 2015-04-08 NOTE — Progress Notes (Signed)
Occupational Therapy Treatment Patient Details Name: MAYES SCHUDEL MRN: [id number] DOB: 1920-12-10 Today's Date: 04/08/2015    History of present illness Pt is a 81 y.o. Male who lives at home alone who presented with gradual onset of weakness with recurrent falls, particularly in the 2 weeks prior to admission. The night prior to admission, he was at the church cleaning and fell and was unable to get up until he was found by church patrons the following morning. He has been hypotensive with oliguric AKI and demand ischemia. He is full code but previous MD had recommended DNR and he is currently still on floor status on midodrine with clinical improvement.    OT comments  Pt sitting up in chair this am, niece present for treatment session. Pt reports he is feeling good this am and didn't need much help getting into chair this am. Pt required set-up for grooming/hygiene tasks, supervision for toileting & toilet hygiene tasks. Pt required increased time and mod verbal cuing to remain on task during treatment session. Pt is progressing towards goals.    Follow Up Recommendations  SNF    Equipment Recommendations  None recommended by OT       Precautions / Restrictions Precautions Precautions: Fall Precaution Comments: Hx of falls Restrictions Weight Bearing Restrictions: No              ADL Overall ADL's : Needs assistance/impaired     Grooming: Wash/dry hands;Wash/dry face;Applying deodorant;Set up;Sitting                       Toileting- Clothing Manipulation and Hygiene: Supervision/safety;Sitting/lateral lean (pt used urinal )                          Cognition   Behavior During Therapy: WFL for tasks assessed/performed Overall Cognitive Status: Within Functional Limits for tasks assessed                                    Pertinent Vitals/ Pain       Pain Assessment: No/denies pain         Frequency Min 2X/week     Progress  Toward Goals  OT Goals(current goals can now be found in the care plan section)  Progress towards OT goals: Progressing toward goals  Acute Rehab OT Goals Patient Stated Goal: Return to home, regain strength  OT Goal Formulation: With patient Time For Goal Achievement: 04/21/15 Potential to Achieve Goals: Good ADL Goals Pt Will Perform Lower Body Dressing: with mod assist;sitting/lateral leans;sit to/from stand Pt Will Transfer to Toilet: with supervision;ambulating;bedside commode Pt Will Perform Toileting - Clothing Manipulation and hygiene: with supervision;sitting/lateral leans;sit to/from stand Additional ADL Goal #1: Patient will increase BUE strength and endurance to increase functional performance during ADL tasks.  Plan Discharge plan remains appropriate       End of Session     Activity Tolerance Patient tolerated treatment well   Patient Left in chair;with call bell/phone within reach;with chair alarm set;with family/visitor present           Time: 1093-2355 OT Time Calculation (min): 40 min  Charges: OT General Charges $OT Visit: 1 Procedure OT Treatments $Self Care/Home Management : 38-52 mins  Ezra Sites, OTR/L  [phone]  04/08/2015, 12:22 PM

## 2015-04-08 NOTE — Care Management Note (Signed)
Case Management Note  Patient Details  Name: Calvin Rivas MRN: [id number] Date of Birth: 1920-11-27  Expected Discharge Date:                  Expected Discharge Plan:  Skilled Nursing Facility  In-House Referral:  Clinical Social Work  Discharge planning Services  CM Consult  Post Acute Care Choice:  NA Choice offered to:  NA  DME Arranged:    DME Agency:     HH Arranged:    HH Agency:     Status of Service:  In process, will continue to follow  Medicare Important Message Given:    Date Medicare IM Given:    Medicare IM give by:    Date Additional Medicare IM Given:    Additional Medicare Important Message give by:     If discussed at Long Length of Stay Meetings, dates discussed:    Additional Comments: Patient is now agreeable to SNF at discharge. CSW is aware and is working to arrange for placement. Discharge not anticipated over weekend.  Malcolm Metro, RN 04/08/2015, 9:11 AM

## 2015-04-09 LAB — CBC
HEMATOCRIT: 42.1 % (ref 39.0–52.0)
Hemoglobin: 13.7 g/dL (ref 13.0–17.0)
MCH: 33.3 pg (ref 26.0–34.0)
MCHC: 32.5 g/dL (ref 30.0–36.0)
MCV: 102.2 fL — AB (ref 78.0–100.0)
Platelets: 123 10*3/uL — ABNORMAL LOW (ref 150–400)
RBC: 4.12 MIL/uL — AB (ref 4.22–5.81)
RDW: 16.6 % — ABNORMAL HIGH (ref 11.5–15.5)
WBC: 7.7 10*3/uL (ref 4.0–10.5)

## 2015-04-09 LAB — RENAL FUNCTION PANEL
ALBUMIN: 2.9 g/dL — AB (ref 3.5–5.0)
Anion gap: 10 (ref 5–15)
BUN: 92 mg/dL — ABNORMAL HIGH (ref 6–20)
CO2: 29 mmol/L (ref 22–32)
Calcium: 8.2 mg/dL — ABNORMAL LOW (ref 8.9–10.3)
Chloride: 100 mmol/L — ABNORMAL LOW (ref 101–111)
Creatinine, Ser: 2.78 mg/dL — ABNORMAL HIGH (ref 0.61–1.24)
GFR calc Af Amer: 21 mL/min — ABNORMAL LOW (ref >60)
GFR calc non Af Amer: 18 mL/min — ABNORMAL LOW (ref >60)
Glucose, Bld: 94 mg/dL (ref 65–99)
PHOSPHORUS: 3.8 mg/dL (ref 2.5–4.6)
Potassium: 5.2 mmol/L — ABNORMAL HIGH (ref 3.5–5.1)
Sodium: 139 mmol/L (ref 135–145)

## 2015-04-09 MED ORDER — SODIUM POLYSTYRENE SULFONATE 15 GM/60ML PO SUSP
45.0000 g | Freq: Once | ORAL | Status: AC
  Administered 2015-04-09: 45 g via ORAL
  Filled 2015-04-09: qty 180

## 2015-04-09 NOTE — Progress Notes (Signed)
[facility] PROGRESS NOTE  Calvin Rivas ZOX:[id number] DOB: 04/22/1921 DOA: 04/04/2015 PCP: Glori Bickers, MD  Brief Summary  Calvin Rivas is a 81 y.o. Male who lives at home alone who presented with gradual onset of weakness with recurrent falls, particularly in the 2 weeks prior to admission.  The night prior to admission, he was at the church cleaning and fell and was unable to get up until he was found by church patrons the following morning.  He has been hypotensive with oliguric AKI and demand ischemia.  He is full code but previous MD had recommended DNR and he is currently still on floor status on midodrine with clinical improvement.  Creatinine and uop are improving as is blood pressure.  Appreciate palliative care assistance with GOC.    Assessment/Plan  Acute on CKD stage 3, baseline creatinine 1.6 suspect ARF from pre-renal azotemia, ACE inhibitor, and Rhabdomyolysis.  Creatinine starting to trend down however BUN remains very elevated and potassium rising to 5.2 -  Continue to hold all antihypertensives -  Appreciate nephrology assistance -  FENa 11% -  RUS with medical renal disease -  Continue diuretic -  We will give 1 dose of Kayexalate 45 g this morning -  Repeat BMP in a.m.  Hypotension, had one episode of hypotension yesterday, blood pressure this morning appears improved -  Remains afebrile -  Continue to hold all antihypertensives -  Palliative care consultation appreciated  Sinus Bradycardia:  suspect secondary to excessive use of Metoprolol. -  Appreciate cardiology assistance -  Hold all anti-hypertensives -  TSH  4.254   Acute on Chronic Systolic CHF:  EF 20-25% 11/2013 ECHO.  Legs have developed bullae and are still very swollen. -  Continue torsemide -  Urine output is approximately stable -  Per cardiology, not a candidate for PPM -  No ACEI or BB due to bradycardia and hypotension  Rhabdomyolysis: secondary to prolonged immobilization, CK  trended down  Generalized weakness: probably secondary to above noted issues.  -  PT/OT eval recommending SNF placement  Mechanical fall:  occured on Saturday.  Denies syncope. Likely secondary to hypotension from incorrectly taking medications.  See above  Thrombocytopenia:  seems to be a chronic issue and improving  Chronic Lower Ext edema: uses Unna boots to control edema.  -  Legs are still too swollen to place Unna boot when able  Social Issues:  lives alone, question whether patient is taking excessive medications inadvertantly, frequent falls-suspect will need SNF on discharge.  -  Social work consult appreciated  Moderate protein calorie malnutrition -  Appreciate nutrition assistance -  Continue ensure  Diet:  Low sodium Access:  PIV IVF:  off Proph:  heparin  Code Status: full Family Communication: patient alone  Disposition Plan:  Likely discharge on Monday if blood pressure remained stable and creatinine continues to trend down and potassium remained stable.  Needs to work with PT/OT and probable SNF placement Monday.    Consultants:  Cardiology  Nephrology  Palliative care  Procedures:  X-ray hips on 6/27: Indeterminate age of an inferior pubic ramus fracture on the left, degenerative narrowing of bilateral hips  Chest x-ray: Cardiomegaly with increased interstitial densities and pulmonary edema, left pleural effusion which is stable from prior  CT head on 6/27: Mild diffuse atrophy  Antibiotics:  None   HPI/Subjective:  States that his legs are weak but he is anxious to start working with physical therapy. He was up in the chair  most of the day yesterday. His leg swelling is about the same. He has been voiding frequently. No bowel movements in 2 days. He denies lightheadedness or dizziness. Objective: Filed Vitals:   04/08/15 0548 04/08/15 1412 04/08/15 2242 04/09/15 0642  BP: 117/70 90/45 111/47 111/59  Pulse: 78 63 64 88  Temp: 97.8 F (36.6 C)  97.6 F (36.4 C) 97.5 F (36.4 C) 97.8 F (36.6 C)  TempSrc: Oral Oral Oral Oral  Resp: 16 16 18 20   Height:      Weight: 60.873 kg (134 lb 3.2 oz)   58.6 kg (129 lb 3 oz)  SpO2: 98% 96% 96% 93%    Intake/Output Summary (Last 24 hours) at 04/09/15 0808 Last data filed at 04/09/15 4098  Gross per 24 hour  Intake      0 ml  Output   1825 ml  Net  -1825 ml   Filed Weights   04/07/15 0536 04/08/15 0548 04/09/15 0642  Weight: 61.825 kg (136 lb 4.8 oz) 60.873 kg (134 lb 3.2 oz) 58.6 kg (129 lb 3 oz)    Exam:   General:  Thin male, No acute distress  HEENT:  NCAT, MMM  Cardiovascular:  RRR, 2+ pulses, warm extremities, telemetry: Normal sinus rhythm with rate in the 40s  Respiratory:   Clear to auscultation bilaterally, no increased WOB  Abdomen:   NABS, soft, NT/ND  MSK:   Normal tone and bulk, 2+ pitting bilateral LEE.  Multiple clear bullae on right lower extremity.  Both legs are slightly warm to touch today but no obvious cellulitis  Neuro:  Diffusely weak and needed assistance to sit up in bed  Psych: Alert and aware of the plan of care, sharp  Data Reviewed: Basic Metabolic Panel:  Recent Labs Lab 04/05/15 0231 04/06/15 0626 04/07/15 0657 04/08/15 0813 04/09/15 0604  NA 138 139 139 140 139  K 4.3 4.3 4.4 4.6 5.2*  CL 104 105 104 102 100*  CO2 25 25 26 27 29   GLUCOSE 130* 117* 110* 105* 94  BUN 85* 93* 93* 94* 92*  CREATININE 3.00* 2.99* 2.81* 2.92* 2.78*  CALCIUM 8.3* 8.0* 8.0* 8.1* 8.2*  PHOS  --   --  3.2 3.2 3.8   Liver Function Tests:  Recent Labs Lab 04/05/15 0231 04/06/15 0626 04/07/15 0657 04/08/15 0813 04/09/15 0604  AST 91* 67*  --   --   --   ALT 40 39  --   --   --   ALKPHOS 87 89  --   --   --   BILITOT 1.2 1.5*  --   --   --   PROT 6.0* 6.1*  --   --   --   ALBUMIN 2.8* 2.8* 2.6* 2.7* 2.9*   No results for input(s): LIPASE, AMYLASE in the last 168 hours. No results for input(s): AMMONIA in the last 168 hours. CBC:  Recent  Labs Lab 04/04/15 1632 04/05/15 0231 04/06/15 0626 04/07/15 0657 04/08/15 0813 04/09/15 0604  WBC 7.3 6.2 6.8 6.6 7.5 7.7  NEUTROABS 5.8  --   --   --   --   --   HGB 12.4* 12.1* 12.6* 13.2 14.2 13.7  HCT 37.7* 37.5* 39.5 40.5 42.6 42.1  MCV 101.6* 101.4* 102.3* 101.3* 100.9* 102.2*  PLT 64* 60* 82* 99* 108* 123*   Cardiac Enzymes:  Recent Labs Lab 04/04/15 1632 04/04/15 2010 04/05/15 0231 04/05/15 0822 04/05/15 1004 04/06/15 0626  CKTOTAL 1525*  --   --   --  583* 338  TROPONINI 0.31* 0.31* 0.25* 0.24*  --   --    BNP (last 3 results)  Recent Labs  04/04/15 1632  BNP 2864.0*    ProBNP (last 3 results) No results for input(s): PROBNP in the last 8760 hours.  CBG: No results for input(s): GLUCAP in the last 168 hours.  Recent Results (from the past 240 hour(s))  Urine culture     Status: None   Collection Time: 04/04/15  5:30 PM  Result Value Ref Range Status   Specimen Description URINE, CLEAN CATCH  Final   Special Requests NONE  Final   Culture   Final    MULTIPLE SPECIES PRESENT, SUGGEST RECOLLECTION IF CLINICALLY INDICATED Performed at [facility]    Report Status 04/06/2015 FINAL  Final     Studies: No results found.  Scheduled Meds: . feeding supplement (ENSURE ENLIVE)  237 mL Oral BID BM  . heparin  5,000 Units Subcutaneous 3 times per day  . sodium chloride  3 mL Intravenous Q12H  . sodium polystyrene  45 g Oral Once  . torsemide  40 mg Oral Daily   Continuous Infusions:    Active Problems:   Acute renal failure   Cardiovascular renal disease   AKI (acute kidney injury)   Rhabdomyolysis   Physical deconditioning   Acute systolic congestive heart failure   HLD (hyperlipidemia)   Venous stasis dermatitis   Falls   Generalized weakness   Bradycardia   Malnutrition of moderate degree   Palliative care encounter   DNR (do not resuscitate) discussion   Acute on chronic systolic congestive heart failure    Time spent: 30  min    Kristena Wilhelmi, [facility]  [facility] Pager [phone]. If 7PM-7AM, please contact night-coverage at [email or web address], password [facility] 04/09/2015, 8:08 AM  LOS: 5 days

## 2015-04-09 NOTE — Progress Notes (Addendum)
Subjective: Patient.Patient offers no complaint and claims he is feeling much better  Objective: Vital signs in last 24 hours: Temp:  [97.5 F (36.4 C)-97.8 F (36.6 C)] 97.8 F (36.6 C) (07/02 0642) Pulse Rate:  [63-88] 88 (07/02 0642) Resp:  [16-20] 20 (07/02 0642) BP: (90-111)/(45-59) 111/59 mmHg (07/02 0642) SpO2:  [93 %-96 %] 93 % (07/02 0642) Weight:  [129 lb 3 oz (58.6 kg)] 129 lb 3 oz (58.6 kg) (07/02 0642)  Intake/Output from previous day: 07/01 0701 - 07/02 0700 In: -  Out: 1825 [Urine:1825] Intake/Output this shift:     Recent Labs  04/07/15 0657 04/08/15 0813 04/09/15 0604  HGB 13.2 14.2 13.7    Recent Labs  04/08/15 0813 04/09/15 0604  WBC 7.5 7.7  RBC 4.22 4.12*  HCT 42.6 42.1  PLT 108* 123*    Recent Labs  04/08/15 0813 04/09/15 0604  NA 140 139  K 4.6 5.2*  CL 102 100*  CO2 27 29  BUN 94* 92*  CREATININE 2.92* 2.78*  GLUCOSE 105* 94  CALCIUM 8.1* 8.2*   No results for input(s): LABPT, INR in the last 72 hours.  Generally patient is alert and sitting up in the chair Chest: Decreased breath sound no rales or rhonchi His heart exam revealed regular rate and rhythm no murmur Extremities he has 1+ edema.  Assessment/Plan: Problem #1 acute kidney injury possibly ATN/ACE inhibitor. His BUN is still high but creatiniing is progresivlely improving  Problem #2 chronic renal failure stage III. Presently he doesn't have any nausea or vomiting. Problem #3 hypotension: His blood pressure is much better Problem #4 CHF. Patient with cardiomyopathy with ejection fraction of 20-25%.  Presently he is on po Demadex and his has a reasonable urine out put about 1800 cc over the last 24 hours. He is asymptoamtic Problem #5 Metabolic bone  disease: His calcium is range Problem #6 thrombocytopenia Problem#7 High  potassium ; His potasium is high normal Plan: 1]:Low potassium diet 2] Continue with Demadex 3] BMP in am   [facility] 04/09/2015,  8:37 AM

## 2015-04-10 LAB — RENAL FUNCTION PANEL
ALBUMIN: 2.6 g/dL — AB (ref 3.5–5.0)
ANION GAP: 9 (ref 5–15)
BUN: 91 mg/dL — AB (ref 6–20)
CHLORIDE: 101 mmol/L (ref 101–111)
CO2: 31 mmol/L (ref 22–32)
CREATININE: 2.68 mg/dL — AB (ref 0.61–1.24)
Calcium: 7.9 mg/dL — ABNORMAL LOW (ref 8.9–10.3)
GFR calc Af Amer: 22 mL/min — ABNORMAL LOW (ref >60)
GFR, EST NON AFRICAN AMERICAN: 19 mL/min — AB (ref >60)
GLUCOSE: 92 mg/dL (ref 65–99)
Phosphorus: 4.1 mg/dL (ref 2.5–4.6)
Potassium: 4.7 mmol/L (ref 3.5–5.1)
Sodium: 141 mmol/L (ref 135–145)

## 2015-04-10 LAB — CBC
HCT: 36.7 % — ABNORMAL LOW (ref 39.0–52.0)
Hemoglobin: 11.8 g/dL — ABNORMAL LOW (ref 13.0–17.0)
MCH: 33.1 pg (ref 26.0–34.0)
MCHC: 32.2 g/dL (ref 30.0–36.0)
MCV: 103.1 fL — AB (ref 78.0–100.0)
Platelets: 118 10*3/uL — ABNORMAL LOW (ref 150–400)
RBC: 3.56 MIL/uL — AB (ref 4.22–5.81)
RDW: 16.8 % — ABNORMAL HIGH (ref 11.5–15.5)
WBC: 5.6 10*3/uL (ref 4.0–10.5)

## 2015-04-10 NOTE — Plan of Care (Signed)
Problem: Phase I Progression Outcomes Goal: Pt./family verbalizes plan of care Outcome: Completed/Met Date Met:  04/10/15 Discussed trending down renal labs, and the changing of the wound dressings.

## 2015-04-10 NOTE — Progress Notes (Signed)
[facility] PROGRESS NOTE  Calvin Rivas ZOX:[id number] DOB: 1920-12-29 DOA: 04/04/2015 PCP: Glori Bickers, MD  Brief Summary  Calvin Rivas is a 81 y.o. Male who lives at home alone who presented with gradual onset of weakness with recurrent falls, particularly in the 2 weeks prior to admission.  The night prior to admission, he was at the church cleaning and fell and was unable to get up until he was found by church patrons the following morning.  He has been hypotensive with oliguric AKI and demand ischemia.  He is full code but previous MD had recommended DNR and he is currently still on floor status on midodrine with clinical improvement.  Creatinine and uop are improving as is blood pressure.  Appreciate palliative care assistance with GOC.    Assessment/Plan  Acute on CKD stage 3, baseline creatinine 1.6 suspect ARF from pre-renal azotemia, ACE inhibitor, and Rhabdomyolysis.  Creatinine is improving. -  Continue to hold all antihypertensives -  Appreciate nephrology assistance -  FENa 11% -  RUS with medical renal disease -  Continue diuretic   Hypotension, appears to be stabilizing -  Remains afebrile -  Continue to hold all antihypertensives -  Palliative care consultation appreciated  Sinus Bradycardia:  suspect secondary to excessive use of Metoprolol. -  Appreciate cardiology assistance -  Hold all anti-hypertensives -  TSH  4.254 -  Improved   Acute on Chronic Systolic CHF:  EF 20-25% 11/2013 ECHO.  Legs have developed bullae and are still very swollen. -  Continue torsemide -  Urine output is approximately stable -  Per cardiology, not a candidate for PPM -  No ACEI or BB due to bradycardia and hypotension - may consider changing torsemide to intravenous lasix since patient still has a fair amount of edema. Will discuss with nephrology.  Rhabdomyolysis: secondary to prolonged immobilization, CK trended down  Generalized weakness: probably secondary to  above noted issues.  -  PT/OT eval recommending SNF placement  Mechanical fall:  occured on Saturday.  Denies syncope. Likely secondary to hypotension from incorrectly taking medications.  See above  Thrombocytopenia:  seems to be a chronic issue and improving  Chronic Lower Ext edema: uses Unna boots to control edema.  -  Legs are still too swollen to place Unna boot when able  Social Issues:  lives alone, question whether patient is taking excessive medications inadvertantly, frequent falls-suspect will need SNF on discharge.  -  Social work consult appreciated  Moderate protein calorie malnutrition -  Appreciate nutrition assistance -  Continue ensure  Diet:  Low sodium Access:  PIV IVF:  off Proph:  heparin  Code Status: full Family Communication: patient alone  Disposition Plan:  SNF placement    Consultants:  Cardiology  Nephrology  Palliative care  Procedures:  X-ray hips on 6/27: Indeterminate age of an inferior pubic ramus fracture on the left, degenerative narrowing of bilateral hips  Chest x-ray: Cardiomegaly with increased interstitial densities and pulmonary edema, left pleural effusion which is stable from prior  CT head on 6/27: Mild diffuse atrophy  Antibiotics:  None   HPI/Subjective: Denies any shortness of breath or chest pain. Unsure if lower extremity edema is improving. No nausea or vomiting. Feels weak.  Objective: Filed Vitals:   04/09/15 0642 04/09/15 1312 04/09/15 2141 04/10/15 0615  BP: 111/59 79/39 100/46 100/57  Pulse: 88 69 65 79  Temp: 97.8 F (36.6 C) 98.1 F (36.7 C) 97.7 F (36.5 C) 97.8 F (36.6 C)  TempSrc: Oral Oral Oral Oral  Resp: Height:      Weight: 58.6 kg (129 lb 3 oz)   58.196 kg (128 lb 4.8 oz)  SpO2: 93% 96% 96% 94%    Intake/Output Summary (Last 24 hours) at 04/10/15 1150 Last data filed at 04/10/15 0616  Gross per 24 hour  Intake      0 ml  Output    875 ml  Net   -875 ml   Filed  Weights   04/08/15 0548 04/09/15 0642 04/10/15 0615  Weight: 60.873 kg (134 lb 3.2 oz) 58.6 kg (129 lb 3 oz) 58.196 kg (128 lb 4.8 oz)    Exam:   General:  Thin male, No acute distress, sitting up in chair  HEENT:  NCAT, MMM  Cardiovascular:  RRR, 2+ pulses, warm extremities,  Respiratory:   Crackles at bases, no increased WOB  Abdomen:   NABS, soft, NT/ND  MSK:   Normal tone and bulk, 2+ pitting bilateral LEE.  Multiple clear bullae on right lower extremity.  Both legs are slightly warm to touch today but no obvious cellulitis  Psych: Alert and aware of the plan of care, sharp  Data Reviewed: Basic Metabolic Panel:  Recent Labs Lab 04/06/15 0626 04/07/15 0657 04/08/15 0813 04/09/15 0604 04/10/15 0552  NA 139 139 140 139 141  K 4.3 4.4 4.6 5.2* 4.7  CL 105 104 102 100* 101  CO2 GLUCOSE 117* 110* 105* 94 92  BUN 93* 93* 94* 92* 91*  CREATININE 2.99* 2.81* 2.92* 2.78* 2.68*  CALCIUM 8.0* 8.0* 8.1* 8.2* 7.9*  PHOS  --  3.2 3.2 3.8 4.1   Liver Function Tests:  Recent Labs Lab 04/05/15 0231 04/06/15 0626 04/07/15 0657 04/08/15 0813 04/09/15 0604 04/10/15 0552  AST 91* 67*  --   --   --   --   ALT 40 39  --   --   --   --   ALKPHOS 87 89  --   --   --   --   BILITOT 1.2 1.5*  --   --   --   --   PROT 6.0* 6.1*  --   --   --   --   ALBUMIN 2.8* 2.8* 2.6* 2.7* 2.9* 2.6*   No results for input(s): LIPASE, AMYLASE in the last 168 hours. No results for input(s): AMMONIA in the last 168 hours. CBC:  Recent Labs Lab 04/04/15 1632  04/06/15 0626 04/07/15 0657 04/08/15 0813 04/09/15 0604 04/10/15 0552  WBC 7.3  < > 6.8 6.6 7.5 7.7 5.6  NEUTROABS 5.8  --   --   --   --   --   --   HGB 12.4*  < > 12.6* 13.2 14.2 13.7 11.8*  HCT 37.7*  < > 39.5 40.5 42.6 42.1 36.7*  MCV 101.6*  < > 102.3* 101.3* 100.9* 102.2* 103.1*  PLT 64*  < > 82* 99* 108* 123* 118*  < > = values in this interval not displayed. Cardiac Enzymes:  Recent Labs Lab  04/04/15 1632 04/04/15 2010 04/05/15 0231 04/05/15 0822 04/05/15 1004 04/06/15 0626  CKTOTAL 1525*  --   --   --  583* 338  TROPONINI 0.31* 0.31* 0.25* 0.24*  --   --    BNP (last 3 results)  Recent Labs  04/04/15 1632  BNP 2864.0*    ProBNP (last 3 results) No results for input(s): PROBNP  in the last 8760 hours.  CBG: No results for input(s): GLUCAP in the last 168 hours.  Recent Results (from the past 240 hour(s))  Urine culture     Status: None   Collection Time: 04/04/15  5:30 PM  Result Value Ref Range Status   Specimen Description URINE, CLEAN CATCH  Final   Special Requests NONE  Final   Culture   Final    MULTIPLE SPECIES PRESENT, SUGGEST RECOLLECTION IF CLINICALLY INDICATED Performed at [facility]    Report Status 04/06/2015 FINAL  Final     Studies: No results found.  Scheduled Meds: . feeding supplement (ENSURE ENLIVE)  237 mL Oral BID BM  . heparin  5,000 Units Subcutaneous 3 times per day  . sodium chloride  3 mL Intravenous Q12H  . torsemide  40 mg Oral Daily   Continuous Infusions:    Active Problems:   Acute renal failure   Cardiovascular renal disease   AKI (acute kidney injury)   Rhabdomyolysis   Physical deconditioning   Acute systolic congestive heart failure   HLD (hyperlipidemia)   Venous stasis dermatitis   Falls   Generalized weakness   Bradycardia   Malnutrition of moderate degree   Palliative care encounter   DNR (do not resuscitate) discussion   Acute on chronic systolic congestive heart failure    Time spent: 30 min    MEMON,JEHANZEB  [facility] Pager [phone]. If 7PM-7AM, please contact night-coverage at [email or web address], password [facility] 04/10/2015, 11:50 AM  LOS: 6 days

## 2015-04-10 NOTE — Progress Notes (Signed)
Subjective: Patient offers no complaint except feeling weak  Objective: Vital signs in last 24 hours: Temp:  [97.7 F (36.5 C)-98.1 F (36.7 C)] 97.8 F (36.6 C) (07/03 0615) Pulse Rate:  [65-79] 79 (07/03 0615) Resp:  [18-20] 18 (07/03 0615) BP: (79-100)/(39-57) 100/57 mmHg (07/03 0615) SpO2:  [94 %-96 %] 94 % (07/03 0615) Weight:  [128 lb 4.8 oz (58.196 kg)] 128 lb 4.8 oz (58.196 kg) (07/03 0615)  Intake/Output from previous day: 07/02 0701 - 07/03 0700 In: -  Out: 875 [Urine:875] Intake/Output this shift:     Recent Labs  04/08/15 0813 04/09/15 0604 04/10/15 0552  HGB 14.2 13.7 11.8*    Recent Labs  04/09/15 0604 04/10/15 0552  WBC 7.7 5.6  RBC 4.12* 3.56*  HCT 42.1 36.7*  PLT 123* 118*    Recent Labs  04/09/15 0604 04/10/15 0552  NA 139 141  K 5.2* 4.7  CL 100* 101  CO2 29 31  BUN 92* 91*  CREATININE 2.78* 2.68*  GLUCOSE 94 92  CALCIUM 8.2* 7.9*   No results for input(s): LABPT, INR in the last 72 hours.  Generally patient is alert and sitting up in the chair Chest: Decreased breath sound no rales or rhonchi His heart exam revealed regular rate and rhythm no murmur Extremities he has 1+ edema.  Assessment/Plan: Problem #1 acute kidney injury possibly ATN/ACE inhibitor. Renal function is improving but still above his base line  Problem #2 chronic renal failure stage III. Presently he doesn't have any nausea or vomiting. Problem #3 hypotension: His blood pressure is much better Problem #4 CHF. Patient with cardiomyopathy with ejection fraction of 20-25%.  Presently he is on po Demadex Patient is none oliguric Problem #5 Metabolic bone  disease: His calcium is range Problem #6 thrombocytopenia Problem#7 High  potassium ; His potassium is normal today Plan: 1] Continue with present treatmenment 2] BMP in am   [facility] S 04/10/2015, 8:27 AM

## 2015-04-11 LAB — RENAL FUNCTION PANEL
ALBUMIN: 2.6 g/dL — AB (ref 3.5–5.0)
ANION GAP: 8 (ref 5–15)
BUN: 93 mg/dL — AB (ref 6–20)
CO2: 32 mmol/L (ref 22–32)
Calcium: 7.9 mg/dL — ABNORMAL LOW (ref 8.9–10.3)
Chloride: 99 mmol/L — ABNORMAL LOW (ref 101–111)
Creatinine, Ser: 2.46 mg/dL — ABNORMAL HIGH (ref 0.61–1.24)
GFR calc non Af Amer: 21 mL/min — ABNORMAL LOW (ref >60)
GFR, EST AFRICAN AMERICAN: 25 mL/min — AB (ref >60)
Glucose, Bld: 95 mg/dL (ref 65–99)
Phosphorus: 3.7 mg/dL (ref 2.5–4.6)
Potassium: 4.6 mmol/L (ref 3.5–5.1)
Sodium: 139 mmol/L (ref 135–145)

## 2015-04-11 LAB — CBC
HEMATOCRIT: 35.2 % — AB (ref 39.0–52.0)
HEMOGLOBIN: 11.5 g/dL — AB (ref 13.0–17.0)
MCH: 33.4 pg (ref 26.0–34.0)
MCHC: 32.7 g/dL (ref 30.0–36.0)
MCV: 102.3 fL — ABNORMAL HIGH (ref 78.0–100.0)
PLATELETS: 113 10*3/uL — AB (ref 150–400)
RBC: 3.44 MIL/uL — AB (ref 4.22–5.81)
RDW: 16.8 % — ABNORMAL HIGH (ref 11.5–15.5)
WBC: 5.6 10*3/uL (ref 4.0–10.5)

## 2015-04-11 LAB — PHOSPHORUS: Phosphorus: 3.7 mg/dL (ref 2.5–4.6)

## 2015-04-11 MED ORDER — FUROSEMIDE 10 MG/ML IJ SOLN
40.0000 mg | Freq: Two times a day (BID) | INTRAMUSCULAR | Status: DC
  Administered 2015-04-11: 40 mg via INTRAVENOUS
  Filled 2015-04-11: qty 4

## 2015-04-11 NOTE — Progress Notes (Signed)
[facility] PROGRESS NOTE  ALGENIS BALLIN ZOX:[id number] DOB: 08-01-1921 DOA: 04/04/2015 PCP: Glori Bickers, MD  Brief Summary  Calvin Rivas is a 81 y.o. Male who lives at home alone who presented with gradual onset of weakness with recurrent falls, particularly in the 2 weeks prior to admission.  The night prior to admission, he was at the church cleaning and fell and was unable to get up until he was found by church patrons the following morning.  He has been hypotensive with oliguric AKI and demand ischemia.  He is full code but previous MD had recommended DNR and he is currently still on floor status on midodrine with clinical improvement.  Creatinine and uop are improving.  Appreciate palliative care assistance with GOC.    Assessment/Plan  Acute on CKD stage 3, baseline creatinine 1.6 suspect ARF from pre-renal azotemia, ACE inhibitor, and Rhabdomyolysis.  Creatinine is improving. -  Continue to hold all antihypertensives -  Appreciate nephrology assistance -  FENa 11% -  RUS with medical renal disease -  Continue diuretic   Hypotension, persistent -  Remains afebrile - Does not appear to be septic/toxic, is rather asymptomatic. Denies dizziness or light headedness, is mentating appropriately -  Continue to hold all antihypertensives - may need to start midodrine -  Palliative care consultation appreciated  Sinus Bradycardia:  suspect secondary to excessive use of Metoprolol. -  Appreciate cardiology assistance -  Hold all anti-hypertensives -  TSH  4.254 -  Improved   Acute on Chronic Systolic CHF:  EF 20-25% 11/2013 ECHO.  Legs have developed bullae and are still very swollen. -  Urine output is approximately stable -  Per cardiology, not a candidate for PPM -  No ACEI or BB due to bradycardia and hypotension - change torsemide to intravenous lasix for better diuresis.  Rhabdomyolysis: secondary to prolonged immobilization, CK trended down  Generalized  weakness: probably secondary to above noted issues.  -  PT/OT eval recommending SNF placement  Mechanical fall:  occured on Saturday.  Denies syncope. Likely secondary to hypotension from incorrectly taking medications.  See above  Thrombocytopenia:  seems to be a chronic issue and improving  Chronic Lower Ext edema: uses Unna boots to control edema.  -  Legs are still too swollen to place Unna boot when able - will request wound care consult  Social Issues:  lives alone, question whether patient is taking excessive medications inadvertantly, frequent falls-suspect will need SNF on discharge.  -  Social work consult appreciated  Moderate protein calorie malnutrition -  Appreciate nutrition assistance -  Continue ensure  Diet:  Low sodium Access:  PIV IVF:  off Proph:  heparin  Code Status: DNR Family Communication: discussed with niece at the bedside Disposition Plan:  SNF placement    Consultants:  Cardiology  Nephrology  Palliative care  Procedures:  X-ray hips on 6/27: Indeterminate age of an inferior pubic ramus fracture on the left, degenerative narrowing of bilateral hips  Chest x-ray: Cardiomegaly with increased interstitial densities and pulmonary edema, left pleural effusion which is stable from prior  CT head on 6/27: Mild diffuse atrophy  Antibiotics:  None   HPI/Subjective: Says he feels tired. Denies any shortness of breath or chest pain. Feels that legs are still edematous  Objective: Filed Vitals:   04/11/15 0641 04/11/15 1323 04/11/15 1330 04/11/15 1408  BP: 82/44  Pulse: 75 65  64  Temp: 98.2 F (36.8 C) 97.9 F (36.6 C)  TempSrc: Oral Oral    Resp: 18 18    Height:      Weight: 58.6 kg (129 lb 3 oz)     SpO2: 93% 96%  94%    Intake/Output Summary (Last 24 hours) at 04/11/15 1851 Last data filed at 04/11/15 1700  Gross per 24 hour  Intake    480 ml  Output   1300 ml  Net   -820 ml   Filed Weights   04/09/15  0642 04/10/15 0615 04/11/15 0641  Weight: 58.6 kg (129 lb 3 oz) 58.196 kg (128 lb 4.8 oz) 58.6 kg (129 lb 3 oz)    Exam:   General:  Thin male, No acute distress, sitting up in chair  HEENT:  NCAT, MMM  Cardiovascular:  RRR, 2+ pulses, warm extremities,  Respiratory:   Crackles at bases, no increased WOB  Abdomen:   NABS, soft, NT/ND  MSK:   Normal tone and bulk, 2+ pitting bilateral LEE.  Large bullae on right lower extremity draining clear fluid. There is surrounding mild erythema.  Psych: Alert and aware of the plan of care, sharp  Data Reviewed: Basic Metabolic Panel:  Recent Labs Lab 04/07/15 0657 04/08/15 0813 04/09/15 0604 04/10/15 0552 04/11/15 0557  NA 139 140 139 141 139  K 4.4 4.6 5.2* 4.7 4.6  CL 104 102 100* 101 99*  CO2 26 27 29 31  32  GLUCOSE 110* 105* 94 92 95  BUN 93* 94* 92* 91* 93*  CREATININE 2.81* 2.92* 2.78* 2.68* 2.46*  CALCIUM 8.0* 8.1* 8.2* 7.9* 7.9*  PHOS 3.2 3.2 3.8 4.1 3.7  3.7   Liver Function Tests:  Recent Labs Lab 04/05/15 0231 04/06/15 0626 04/07/15 0657 04/08/15 0813 04/09/15 0604 04/10/15 0552 04/11/15 0557  AST 91* 67*  --   --   --   --   --   ALT 40 39  --   --   --   --   --   ALKPHOS 87 89  --   --   --   --   --   BILITOT 1.2 1.5*  --   --   --   --   --   PROT 6.0* 6.1*  --   --   --   --   --   ALBUMIN 2.8* 2.8* 2.6* 2.7* 2.9* 2.6* 2.6*   No results for input(s): LIPASE, AMYLASE in the last 168 hours. No results for input(s): AMMONIA in the last 168 hours. CBC:  Recent Labs Lab 04/07/15 0657 04/08/15 0813 04/09/15 0604 04/10/15 0552 04/11/15 0557  WBC 6.6 7.5 7.7 5.6 5.6  HGB 13.2 14.2 13.7 11.8* 11.5*  HCT 40.5 42.6 42.1 36.7* 35.2*  MCV 101.3* 100.9* 102.2* 103.1* 102.3*  PLT 99* 108* 123* 118* 113*   Cardiac Enzymes:  Recent Labs Lab 04/04/15 2010 04/05/15 0231 04/05/15 0822 04/05/15 1004 04/06/15 0626  CKTOTAL  --   --   --  583* 338  TROPONINI 0.31* 0.25* 0.24*  --   --    BNP  (last 3 results)  Recent Labs  04/04/15 1632  BNP 2864.0*    ProBNP (last 3 results) No results for input(s): PROBNP in the last 8760 hours.  CBG: No results for input(s): GLUCAP in the last 168 hours.  Recent Results (from the past 240 hour(s))  Urine culture     Status: None   Collection Time: 04/04/15  5:30 PM  Result Value Ref Range Status   Specimen Description  URINE, CLEAN CATCH  Final   Special Requests NONE  Final   Culture   Final    MULTIPLE SPECIES PRESENT, SUGGEST RECOLLECTION IF CLINICALLY INDICATED Performed at [facility]    Report Status 04/06/2015 FINAL  Final     Studies: No results found.  Scheduled Meds: . feeding supplement (ENSURE ENLIVE)  237 mL Oral BID BM  . furosemide  40 mg Intravenous BID  . heparin  5,000 Units Subcutaneous 3 times per day  . sodium chloride  3 mL Intravenous Q12H   Continuous Infusions:    Active Problems:   Acute renal failure   Cardiovascular renal disease   AKI (acute kidney injury)   Rhabdomyolysis   Physical deconditioning   Acute systolic congestive heart failure   HLD (hyperlipidemia)   Venous stasis dermatitis   Falls   Generalized weakness   Bradycardia   Malnutrition of moderate degree   Palliative care encounter   DNR (do not resuscitate) discussion   Acute on chronic systolic congestive heart failure    Time spent: 30 min    Trenten Watchman  [facility] Pager [phone]. If 7PM-7AM, please contact night-coverage at [email or web address], password [facility] 04/11/2015, 6:51 PM  LOS: 7 days

## 2015-04-11 NOTE — Progress Notes (Signed)
Subjective: Patient denies any difficulty in breathing and appetite is improving  Objective: Vital signs in last 24 hours: Temp:  [97.9 F (36.6 C)-98.3 F (36.8 C)] 98.2 F (36.8 C) (07/04 0641) Pulse Rate:  [75-81] 75 (07/04 0641) Resp:  [18-20] 18 (07/04 0641) BP: (73-99)/(40-62) 81/40 mmHg (07/04 0641) SpO2:  [93 %-96 %] 93 % (07/04 0641) Weight:  [129 lb 3 oz (58.6 kg)] 129 lb 3 oz (58.6 kg) (07/04 0641)  Intake/Output from previous day: 07/03 0701 - 07/04 0700 In: 720 [P.O.:720] Out: 2125 [Urine:2125] Intake/Output this shift: Total I/O In: -  Out: 175 [Urine:175]   Recent Labs  04/09/15 0604 04/10/15 0552 04/11/15 0557  HGB 13.7 11.8* 11.5*    Recent Labs  04/10/15 0552 04/11/15 0557  WBC 5.6 5.6  RBC 3.56* 3.44*  HCT 36.7* 35.2*  PLT 118* 113*    Recent Labs  04/10/15 0552 04/11/15 0557  NA 141 139  K 4.7 4.6  CL 101 99*  CO2 31 32  BUN 91* 93*  CREATININE 2.68* 2.46*  GLUCOSE 92 95  CALCIUM 7.9* 7.9*   No results for input(s): LABPT, INR in the last 72 hours.  Generally patient is alert and sitting up in the chair Chest: Decreased breath sound no rales or rhonchi His heart exam revealed regular rate and rhythm no murmur Extremities he has 1+ edema.  Assessment/Plan: Problem #1 acute kidney injury possibly ATN/ACE inhibitor. His creatinine is 2.46 slowly declining. Still remains above his base line  Problem #2 chronic renal failure stage III. Asymptomatic  Problem #3 hypotension: His blood pressure is much better Problem #4 CHF. Patient with cardiomyopathy with ejection fraction of 20-25%.  Presently on Demadex and had 2100 cc over the last 24 hours. He has still edema but denies any difficulty in breathing Problem #5 Metabolic bone  disease: His calcium is range Problem #6 thrombocytopenia Problem#7 High  potassium ; His potassium is normal Problem#8 Metabolic bone disease : His calcium and phosphorus is in range Plan: 1] Continue with  present treatmenment 2] BMP in am   [facility] S 04/11/2015, 8:32 AM

## 2015-04-11 NOTE — Clinical SW OB High Risk (Signed)
CSW spoke with [place] at [facility] in [place], [place].  CSW provided update on patient. CSW advised that per attending, patient would be discharged tomorrow.   [name], [place] [phone]

## 2015-04-11 NOTE — Progress Notes (Signed)
Late Entry:  MD voiced some concerns over the patients BP being low.  Voiced to him that it had been running low at times and that I wasn't sure if it was related to his positioning or his low EF.  We discussed the cuff size we were using on the patient and he stated to try to use a smaller cuff than the regular size cuff.  I re checked the BP first manually then with the small cuff.  All results were given to the MD.  Voiced to the MD that the patient seems to be asymptomatic with the pressure AEB he voices no complaints or voices no concerns.   The blister on his leg is draining a lot of serous fluid.  So much that it is leaking onto the floor.  There is also another blister above that one that is filled with what looks like serosanguinous fluid. Its about nickel size.  The larger one is below that one is also red in color.  I changed the dressing to multiple 4x4's, non adhesive drg, and wrapped lightly with cling dressing.

## 2015-04-11 NOTE — Progress Notes (Signed)
Physical Therapy Treatment Patient Details Name: Calvin Rivas MRN: [id number] DOB: Apr 27, 1921 Today's Date: 04/11/2015    History of Present Illness Pt is a 81 y.o. Male who lives at home alone who presented with gradual onset of weakness with recurrent falls, particularly in the 2 weeks prior to admission. The night prior to admission, he was at the church cleaning and fell and was unable to get up until he was found by church patrons the following morning. He has been hypotensive with oliguric AKI and demand ischemia. He is full code but previous MD had recommended DNR and he is currently still on floor status on midodrine with clinical improvement.     PT Comments    Patient found sitting upright in chair with family in room and significant amount of draining coming from R LE wound; patient and family present stated that RN was on her way to re-dress wound. Sit to stands with Min(A) and cues for proper use of walker. Ambulation approximately 24ft twice with standard walker however patient used very unsafe technique with walker, with one hand on walker and one wall/furniture walking despite cues and fairly unsteady throughout. Toileting with Min-Mod(A) today. Mod cues for safety and technique throughout session. Patient left sitting upright in chair with nursing staff present preparing to take vitals and re-dress wound; chair alarm had been activated. Current plan of care and discharge recommendation remain appropriate.    Follow Up Recommendations  SNF     Equipment Recommendations  Rolling walker with 5" wheels    Recommendations for Other Services       Precautions / Restrictions Precautions Precautions: Fall Precaution Comments: Hx of falls Restrictions Weight Bearing Restrictions: No    Mobility  Bed Mobility                  Transfers Overall transfer level: Needs assistance Equipment used: Rolling walker (2 wheeled) Transfers: Sit to/from Stand;Stand Pivot  Transfers Sit to Stand: Min assist Stand pivot transfers: Min assist       General transfer comment: increased time needed to complete.   Ambulation/Gait Ambulation/Gait assistance: Mod assist Ambulation Distance (Feet):  (74ft, 33ft ) Assistive device: Rolling walker (2 wheeled) Gait Pattern/deviations: Step-to pattern;Decreased step length - right;Decreased step length - left;Decreased stance time - right;Decreased stride length;Decreased dorsiflexion - right;Decreased dorsiflexion - left;Decreased weight shift to right;Trunk flexed;Narrow base of support   Gait velocity interpretation: Below normal speed for age/gender General Gait Details: very unsafe and unsteady, wall walking    Stairs            Wheelchair Mobility    Modified Rankin (Stroke Patients Only)       Balance Overall balance assessment: Needs assistance Sitting-balance support: Bilateral upper extremity supported Sitting balance-Leahy Scale: Fair   Postural control: Posterior lean;Right lateral lean Standing balance support: Single extremity supported Standing balance-Leahy Scale: Fair                      Cognition Arousal/Alertness: Awake/alert Behavior During Therapy: WFL for tasks assessed/performed Overall Cognitive Status: Within Functional Limits for tasks assessed                      Exercises      General Comments        Pertinent Vitals/Pain Pain Assessment: No/denies pain Pain Location: reported pain in toes with standing, which did significantly impact gait mechanics  Pain Intervention(s): Monitored during session    Home  Living                      Prior Function            PT Goals (current goals can now be found in the care plan section) Acute Rehab PT Goals Patient Stated Goal: Return to home, regain strength  PT Goal Formulation: With patient Time For Goal Achievement: 04/20/15 Potential to Achieve Goals: Fair Progress towards PT goals:  Progressing toward goals    Frequency  Min 3X/week    PT Plan Current plan remains appropriate    Co-evaluation             End of Session Equipment Utilized During Treatment: Gait belt Activity Tolerance: Patient limited by fatigue;Patient limited by pain Patient left: in chair;with nursing/sitter in room;with family/visitor present     Time: 9449-6759 PT Time Calculation (min) (ACUTE ONLY): 32 min  Charges:  $Gait Training: 8-22 mins $Therapeutic Activity: 8-22 mins                    G Codes:      Nedra Hai PT, DPT [phone]

## 2015-04-12 DIAGNOSIS — E440 Moderate protein-calorie malnutrition: Secondary | ICD-10-CM

## 2015-04-12 DIAGNOSIS — I9589 Other hypotension: Secondary | ICD-10-CM

## 2015-04-12 DIAGNOSIS — N179 Acute kidney failure, unspecified: Secondary | ICD-10-CM | POA: Insufficient documentation

## 2015-04-12 DIAGNOSIS — I8311 Varicose veins of right lower extremity with inflammation: Secondary | ICD-10-CM

## 2015-04-12 DIAGNOSIS — I130 Hypertensive heart and chronic kidney disease with heart failure and stage 1 through stage 4 chronic kidney disease, or unspecified chronic kidney disease: Secondary | ICD-10-CM

## 2015-04-12 DIAGNOSIS — I519 Heart disease, unspecified: Secondary | ICD-10-CM

## 2015-04-12 DIAGNOSIS — E785 Hyperlipidemia, unspecified: Secondary | ICD-10-CM

## 2015-04-12 DIAGNOSIS — R5381 Other malaise: Secondary | ICD-10-CM

## 2015-04-12 DIAGNOSIS — R531 Weakness: Secondary | ICD-10-CM

## 2015-04-12 DIAGNOSIS — M6282 Rhabdomyolysis: Secondary | ICD-10-CM | POA: Insufficient documentation

## 2015-04-12 DIAGNOSIS — I8312 Varicose veins of left lower extremity with inflammation: Secondary | ICD-10-CM

## 2015-04-12 LAB — RENAL FUNCTION PANEL
ALBUMIN: 2.7 g/dL — AB (ref 3.5–5.0)
Anion gap: 10 (ref 5–15)
BUN: 95 mg/dL — ABNORMAL HIGH (ref 6–20)
CALCIUM: 8.1 mg/dL — AB (ref 8.9–10.3)
CHLORIDE: 98 mmol/L — AB (ref 101–111)
CO2: 32 mmol/L (ref 22–32)
CREATININE: 2.3 mg/dL — AB (ref 0.61–1.24)
GFR calc Af Amer: 27 mL/min — ABNORMAL LOW (ref >60)
GFR calc non Af Amer: 23 mL/min — ABNORMAL LOW (ref >60)
Glucose, Bld: 95 mg/dL (ref 65–99)
Phosphorus: 4.1 mg/dL (ref 2.5–4.6)
Potassium: 4.5 mmol/L (ref 3.5–5.1)
Sodium: 140 mmol/L (ref 135–145)

## 2015-04-12 LAB — CBC
HCT: 36.3 % — ABNORMAL LOW (ref 39.0–52.0)
Hemoglobin: 12 g/dL — ABNORMAL LOW (ref 13.0–17.0)
MCH: 33.7 pg (ref 26.0–34.0)
MCHC: 33.1 g/dL (ref 30.0–36.0)
MCV: 102 fL — ABNORMAL HIGH (ref 78.0–100.0)
PLATELETS: 106 10*3/uL — AB (ref 150–400)
RBC: 3.56 MIL/uL — AB (ref 4.22–5.81)
RDW: 16.9 % — ABNORMAL HIGH (ref 11.5–15.5)
WBC: 5.4 10*3/uL (ref 4.0–10.5)

## 2015-04-12 MED ORDER — FUROSEMIDE 10 MG/ML IJ SOLN
60.0000 mg | Freq: Two times a day (BID) | INTRAMUSCULAR | Status: DC
Start: 2015-04-12 — End: 2015-04-14
  Administered 2015-04-12 – 2015-04-13 (×4): 60 mg via INTRAVENOUS
  Filled 2015-04-12 (×4): qty 6

## 2015-04-12 MED ORDER — METOLAZONE 5 MG PO TABS
2.5000 mg | ORAL_TABLET | Freq: Once | ORAL | Status: AC
  Administered 2015-04-12: 2.5 mg via ORAL
  Filled 2015-04-12: qty 1

## 2015-04-12 MED ORDER — MIDODRINE HCL 5 MG PO TABS
5.0000 mg | ORAL_TABLET | Freq: Three times a day (TID) | ORAL | Status: DC
  Administered 2015-04-12 – 2015-04-14 (×8): 5 mg via ORAL
  Filled 2015-04-12 (×8): qty 1

## 2015-04-12 NOTE — Progress Notes (Signed)
Subjective: Patient complains of some weakness and lack of sleep. His appetite is good and no difficulty breathing.  Objective: Vital signs in last 24 hours: Temp:  [97.9 F (36.6 C)-98.9 F (37.2 C)] 98.5 F (36.9 C) (07/05 0541) Pulse Rate:  [64-79] 79 (07/05 0541) Resp:  [18-21] 21 (07/05 0541) BP: (70-112)/(37-63) 100/60 mmHg (07/05 0541) SpO2:  [94 %-96 %] 95 % (07/05 0541) Weight:  [120 lb (54.432 kg)] 120 lb (54.432 kg) (07/05 0541)  Intake/Output from previous day: 07/04 0701 - 07/05 0700 In: 240 [P.O.:240] Out: 1726 [Urine:1725; Stool:1] Intake/Output this shift:     Recent Labs  04/10/15 0552 04/11/15 0557 04/12/15 0620  HGB 11.8* 11.5* 12.0*    Recent Labs  04/11/15 0557 04/12/15 0620  WBC 5.6 5.4  RBC 3.44* 3.56*  HCT 35.2* 36.3*  PLT 113* 106*    Recent Labs  04/11/15 0557 04/12/15 0620  NA 139 140  K 4.6 4.5  CL 99* 98*  CO2 32 32  BUN 93* 95*  CREATININE 2.46* 2.30*  GLUCOSE 95 95  CALCIUM 7.9* 8.1*   No results for input(s): LABPT, INR in the last 72 hours.  Generally patient is alert and sitting up in the chair Chest: Decreased breath sound no rales or rhonchi His heart exam revealed regular rate and rhythm no murmur Extremities he has 1+ edema.  Assessment/Plan: Problem #1 acute kidney injury possibly ATN/ACE inhibitor. His renal function continued to improve he was on his BUN remains high.  Problem #2 chronic renal failure stage III. Asymptomatic  Problem #3 hypotension: His blood pressure is still fluctuating. Problem #4 CHF. Patient with cardiomyopathy with ejection fraction of 20-25%. Patient is still with edema he was to its improving. Presently he is on IV Lasix and his urine output is reasonable. Problem #5 Metabolic bone  disease: His calcium and phosphorus is range Problem #6 thrombocytopenia Problem#7 High  potassium ; His potassium is normal Problem#8 Metabolic bone disease : His calcium and phosphorus is in  range Plan: 1] change Lasix to 60 mg IV twice a day 2] midodrine 5 mg by mouth 3 times a day 3] BMP in am   [facility] S 04/12/2015, 7:22 AM

## 2015-04-12 NOTE — Progress Notes (Signed)
Consulting cardiologist: Prentice Docker MD Primary Cardiologist: Prentice Docker MD  Cardiology Specific Problem List: 1.Bradycardia 2. Ischemic Cardiomyopathy 3. Hypertension  Subjective:    Denies chest pain and shortness of breath. Says he has some ankle swelling.  Objective:   Temp:  [97.9 F (36.6 C)-98.9 F (37.2 C)] 98.5 F (36.9 C) (07/05 0541) Pulse Rate:  [64-79] 79 (07/05 0541) Resp:  [18-21] 21 (07/05 0541) BP: (70-112)/(37-63) 100/60 mmHg (07/05 0541) SpO2:  [94 %-96 %] 95 % (07/05 0541) Weight:  [120 lb (54.432 kg)] 120 lb (54.432 kg) (07/05 0541) Last BM Date: 04/11/15 (per patient )  Filed Weights   04/10/15 0615 04/11/15 0641 04/12/15 0541  Weight: 128 lb 4.8 oz (58.196 kg) 129 lb 3 oz (58.6 kg) 120 lb (54.432 kg)    Intake/Output Summary (Last 24 hours) at 04/12/15 1020 Last data filed at 04/12/15 0547  Gross per 24 hour  Intake    240 ml  Output   1551 ml  Net  -1311 ml    Telemetry:None   Exam:  General: No acute distress.  HEENT: Conjunctiva and lids normal, oropharynx clear.  Lungs: Clear to auscultation, mild crackles in the bases.   Cardiac: Mild HJR, with JVP at 45 degrees,  or bruits. RRR, 1/6 systolic murmur,    Abdomen: Normoactive bowel sounds, nontender, nondistended.  Extremities: No pitting edema, some puffiness in the legs, with a dressing to the right pre-tibial area, distal pulses full.  Neuropsychiatric: Alert and oriented x3, affect appropriate.   Lab Results:  Basic Metabolic Panel:  Recent Labs Lab 04/10/15 0552 04/11/15 0557 04/12/15 0620  NA 141 139 140  K 4.7 4.6 4.5  CL 101 99* 98*  CO2 31 32 32  GLUCOSE 92 95 95  BUN 91* 93* 95*  CREATININE 2.68* 2.46* 2.30*  CALCIUM 7.9* 7.9* 8.1*    Liver Function Tests:  Recent Labs Lab 04/06/15 0626  04/10/15 0552 04/11/15 0557 04/12/15 0620  AST 67*  --   --   --   --   ALT 39  --   --   --   --   ALKPHOS 89  --   --   --   --   BILITOT  1.5*  --   --   --   --   PROT 6.1*  --   --   --   --   ALBUMIN 2.8*  < > 2.6* 2.6* 2.7*  < > = values in this interval not displayed.  CBC:  Recent Labs Lab 04/10/15 0552 04/11/15 0557 04/12/15 0620  WBC 5.6 5.6 5.4  HGB 11.8* 11.5* 12.0*  HCT 36.7* 35.2* 36.3*  MCV 103.1* 102.3* 102.0*  PLT 118* 113* 106*    Cardiac Enzymes:  Recent Labs Lab 04/06/15 0626  CKTOTAL 338    Medications:   Scheduled Medications: . feeding supplement (ENSURE ENLIVE)  237 mL Oral BID BM  . furosemide  60 mg Intravenous BID  . heparin  5,000 Units Subcutaneous 3 times per day  . midodrine  5 mg Oral TID WC  . sodium chloride  3 mL Intravenous Q12H    Infusions:    PRN Medications: acetaminophen **OR** acetaminophen, ondansetron **OR** ondansetron (ZOFRAN) IV, oxyCODONE   Assessment and Plan:   1. Ischemic Cardiomyopathy with CHF:  No evidence of significant fluid overload, but has mild HJR, some JVD, and puffiness in the lower extremities. He is on lasix 60 mg BID, midodrine is also on  board due to hypotension. Creatinine is 2.30, down from 2.46. He diuresed 1.7 liters overnight. Will give one dose of metolazone 2.5 mg X 1. BP is 100/60, HR is 79. Can consider dobutamine infusion if necessary if fluid diureses is not achieved. Follow up BMET in am.   2. Hypotension: He is stable currently. Ok to have systolic BP in the low 90's with EF of 20%. ACE, BB have been discontinued.   3. Bradycardia: Resolved currently. Not on telemetry.   4. DNR  Calvin Rivas. Lawrence NP [facility]  04/12/2015, 10:20 AM   The patient was seen and examined, and I agree with the assessment and plan as documented above, with modifications as noted below. Pt well known to me from clinic. Denies chest pain/SOB at present, says he has mild ankle swelling. Diuresis somewhat limited by hypotension. However, has had nearly 3 L output on current regimen. Would continue IV Lasix 60 mg bid. Will give one dose of metolazone  2.5 mg. Would not bee too aggressive. He has end-stage heart failure with concomitant cardiorenal syndrome. SBP 90-100 mmHg at present. Spoke with Dr. Kerry Hough. Initial discussion involved consideration of 24 hours of inotropic therapy but after evaluating him, I have decided to hold off on this.

## 2015-04-12 NOTE — Consult Note (Signed)
WOC wound consult note Reason for Consult: Blistering to right lower extremities.  Generalized edema to bilateral lower extremities.  Currently diuresing with Lasix BID.  Wound type: Blistering to right lower extremities.  Partial thickness injury.  Pressure Ulcer POA: N/A Measurement: Superior blister Right dorsal calf 2 cm x 2 cm serum filled intact blister Distal ruptured serum filled blister 7 cm x 6 cm x 0.1 Wound CBU:[id number] Drainage (amount, consistency, odor) Distal blister has ruptured serous fluid Superior blister remains intact.  Periwound:Generalized edema and some periwound erythema to distal, ruptured lesion.  Dressing procedure/placement/frequency:Cleanse right lower leg with NS and pat gently dry.  Apply silicone contact layer to blistered lesions.  Cover with 4x4 gauze and secure with kerlix and tape.  Change Tues-Thurs-Sat. Will not follow at this time.  Please re-consult if needed.  Maple Hudson RN BSN [facility] Pager [phone]

## 2015-04-12 NOTE — Progress Notes (Signed)
[facility] PROGRESS NOTE  Calvin Rivas:[id number] DOB: [birth year] DOA: 04/04/2015 PCP: Glori Bickers, MD  Brief Summary  Calvin Rivas is a 81 y.o. Male who lives at home alone who presented with gradual onset of weakness with recurrent falls, particularly in the 2 weeks prior to admission.  The night prior to admission, he was at the church cleaning and fell and was unable to get up until he was found by church patrons the following morning.  He had been hypotensive with oliguric AKI and demand ischemia.  He was full code but after meeting with palliative care has been made DO NOT RESUSCITATE. He is uncertain on Midrin for hypotension. Cardiology and nephrology are following. He is now on intravenous Lasix and metolazone for acute on chronic systolic heart failure. Renal failure is also improving with diuresis. Once he has been adequately diuresed and blood pressure stable, he can be discharged to skilled nursing facility.    Assessment/Plan  Acute on CKD stage 3, baseline creatinine 1.6 suspect ARF from pre-renal azotemia, ACE inhibitor, and Rhabdomyolysis.  Creatinine is improving. -  Continue to hold all antihypertensives -  Appreciate nephrology assistance -  FENa 11% -  RUS with medical renal disease -  Continue diuretic   Hypotension, persistent, likely related to low EF -  Remains afebrile - Does not appear to be septic/toxic, is rather asymptomatic. Denies dizziness or light headedness, is mentating appropriately -  Continue to hold all antihypertensives -  On midodrine -  Palliative care consultation appreciated  Sinus Bradycardia:  suspect secondary to excessive use of Metoprolol. -  Appreciate cardiology assistance -  Hold all anti-hypertensives -  TSH  4.254 -  Improved   Acute on Chronic Systolic CHF:  EF 20-25% 11/2013 ECHO.  Legs have developed bullae and are still swollen. -  Urine output is approximately stable -  Per cardiology, not a candidate  for PPM -  No ACEI or BB due to bradycardia and hypotension - currently on intravenous lasix. Metolazone added to regimen today.  Rhabdomyolysis: secondary to prolonged immobilization, CK trended down  Generalized weakness: probably secondary to above noted issues.  -  PT/OT eval recommending SNF placement  Mechanical fall:  occured on Saturday.  Denies syncope. Likely secondary to hypotension from incorrectly taking medications.  See above  Thrombocytopenia:  seems to be a chronic issue. Stable.  Chronic Lower Ext edema: uses Unna boots to control edema.  -  Legs are still too swollen to place Unna boot when able - appreciate wound care consult  Social Issues:  lives alone, question whether patient is taking excessive medications inadvertantly, frequent falls-suspect will need SNF on discharge.  -  Social work consult appreciated  Moderate protein calorie malnutrition -  Appreciate nutrition assistance -  Continue ensure  Diet:  Low sodium Access:  PIV IVF:  off Proph:  heparin  Code Status: DNR Family Communication: discussed with niece at the bedside Disposition Plan:  SNF placement    Consultants:  Cardiology  Nephrology  Palliative care  Procedures:  X-ray hips on 6/27: Indeterminate age of an inferior pubic ramus fracture on the left, degenerative narrowing of bilateral hips  Chest x-ray: Cardiomegaly with increased interstitial densities and pulmonary edema, left pleural effusion which is stable from prior  CT head on 6/27: Mild diffuse atrophy  Antibiotics:  None   HPI/Subjective: Feels tired today. Did not sleep well. No chest pain or shortness of breath  Objective: Filed Vitals:   04/11/15 1408  04/11/15 2211 04/12/15 0541 04/12/15 1449  BP: 82/44 112/63 100/60 125/60  Pulse: 64 79 79 79  Temp:  98.9 F (37.2 C) 98.5 F (36.9 C) 98.1 F (36.7 C)  TempSrc:  Oral Oral   Resp:  18 21 20   Height:      Weight:   54.432 kg (120 lb)   SpO2: 94%   95% 94%    Intake/Output Summary (Last 24 hours) at 04/12/15 1848 Last data filed at 04/12/15 1700  Gross per 24 hour  Intake    480 ml  Output   2551 ml  Net  -2071 ml   Filed Weights   04/10/15 0615 04/11/15 0641 04/12/15 0541  Weight: 58.196 kg (128 lb 4.8 oz) 58.6 kg (129 lb 3 oz) 54.432 kg (120 lb)    Exam:   General:  Thin male, No acute distress, sitting up in chair  HEENT:  NCAT, MMM  Cardiovascular:  RRR, 2+ pulses, warm extremities,  Respiratory:   Crackles at bases, normal respiratory effort  Abdomen:   NABS, soft, NT/ND  MSK:   Normal tone and bulk, 2+ pitting bilateral LEE, improving.  Large bullae on right lower extremity draining clear fluid. There is surrounding mild erythema.  Psych: Alert and aware of the plan of care, sharp  Data Reviewed: Basic Metabolic Panel:  Recent Labs Lab 04/08/15 0813 04/09/15 0604 04/10/15 0552 04/11/15 0557 04/12/15 0620  NA 140 139 141 139 140  K 4.6 5.2* 4.7 4.6 4.5  CL 102 100* 101 99* 98*  CO2 27 29 31  32 32  GLUCOSE 105* 94 92 95 95  BUN 94* 92* 91* 93* 95*  CREATININE 2.92* 2.78* 2.68* 2.46* 2.30*  CALCIUM 8.1* 8.2* 7.9* 7.9* 8.1*  PHOS 3.2 3.8 4.1 3.7  3.7 4.1   Liver Function Tests:  Recent Labs Lab 04/06/15 0626  04/08/15 0813 04/09/15 0604 04/10/15 0552 04/11/15 0557 04/12/15 0620  AST 67*  --   --   --   --   --   --   ALT 39  --   --   --   --   --   --   ALKPHOS 89  --   --   --   --   --   --   BILITOT 1.5*  --   --   --   --   --   --   PROT 6.1*  --   --   --   --   --   --   ALBUMIN 2.8*  < > 2.7* 2.9* 2.6* 2.6* 2.7*  < > = values in this interval not displayed. No results for input(s): LIPASE, AMYLASE in the last 168 hours. No results for input(s): AMMONIA in the last 168 hours. CBC:  Recent Labs Lab 04/08/15 0813 04/09/15 0604 04/10/15 0552 04/11/15 0557 04/12/15 0620  WBC 7.5 7.7 5.6 5.6 5.4  HGB 14.2 13.7 11.8* 11.5* 12.0*  HCT 42.6 42.1 36.7* 35.2* 36.3*  MCV 100.9*  102.2* 103.1* 102.3* 102.0*  PLT 108* 123* 118* 113* 106*   Cardiac Enzymes:  Recent Labs Lab 04/06/15 0626  CKTOTAL 338   BNP (last 3 results)  Recent Labs  04/04/15 1632  BNP 2864.0*    ProBNP (last 3 results) No results for input(s): PROBNP in the last 8760 hours.  CBG: No results for input(s): GLUCAP in the last 168 hours.  Recent Results (from the past 240 hour(s))  Urine culture  Status: None   Collection Time: 04/04/15  5:30 PM  Result Value Ref Range Status   Specimen Description URINE, CLEAN CATCH  Final   Special Requests NONE  Final   Culture   Final    MULTIPLE SPECIES PRESENT, SUGGEST RECOLLECTION IF CLINICALLY INDICATED Performed at [facility]    Report Status 04/06/2015 FINAL  Final     Studies: No results found.  Scheduled Meds: . feeding supplement (ENSURE ENLIVE)  237 mL Oral BID BM  . furosemide  60 mg Intravenous BID  . heparin  5,000 Units Subcutaneous 3 times per day  . midodrine  5 mg Oral TID WC  . sodium chloride  3 mL Intravenous Q12H   Continuous Infusions:    Active Problems:   Acute renal failure   Cardiovascular renal disease   AKI (acute kidney injury)   Rhabdomyolysis   Physical deconditioning   Acute systolic congestive heart failure   HLD (hyperlipidemia)   Venous stasis dermatitis   Falls   Generalized weakness   Bradycardia   Malnutrition of moderate degree   Palliative care encounter   DNR (do not resuscitate) discussion   Acute on chronic systolic congestive heart failure    Time spent: 30 min    Burnetta Kohls  [facility] Pager [phone]. If 7PM-7AM, please contact night-coverage at [email or web address], password [facility] 04/12/2015, 6:48 PM  LOS: 8 days

## 2015-04-12 NOTE — Progress Notes (Signed)
Occupational Therapy Treatment Patient Details Name: Calvin Rivas MRN: [id number] DOB: 1921/03/08 Today's Date: 04/12/2015    History of present illness Pt is a 81 y.o. Male who lives at home alone who presented with gradual onset of weakness with recurrent falls, particularly in the 2 weeks prior to admission. The night prior to admission, he was at the church cleaning and fell and was unable to get up until he was found by church patrons the following morning. He has been hypotensive with oliguric AKI and demand ischemia. He is full code but previous MD had recommended DNR and he is currently still on floor status on midodrine with clinical improvement.    OT comments  Pt eating breakfast upon OTR arrival, required set-up to complete meal. Pt refused to sit at EOB or transfer to chair this am, stating "I will stay in bed until around 12 then get up and be in the chair until tonight." Pt agreed to participate in BUE strengthening while sitting up in bed. Pt completed exercises with multiple short rest breaks due to muscle fatigue, along with mod-max verbal cuing to remain on task. Pt completed toileting task using urinal at bed level, Mod I for completion. Pt discharge plan to SNF remains appropriate.    Follow Up Recommendations  SNF    Equipment Recommendations  None recommended by OT       Precautions / Restrictions Precautions Precautions: Fall Precaution Comments: Hx of falls       Mobility Bed Mobility Overal bed mobility:  (pt refused to sit at EOB or in chair this am)                       ADL   Eating/Feeding: Set up                       Toilet Transfer: Modified Independent (urinal)   Toileting- Clothing Manipulation and Hygiene: Modified independent                          Cognition   Behavior During Therapy: WFL for tasks assessed/performed Overall Cognitive Status: Within Functional Limits for tasks assessed                         Exercises General Exercises - Upper Extremity Shoulder Flexion: AROM;10 reps Shoulder ABduction: AROM;10 reps Shoulder ADduction: AROM;10 reps Shoulder Horizontal ABduction: AROM;10 reps Shoulder Horizontal ADduction: AROM;10 reps Elbow Flexion: AROM;10 reps Elbow Extension: AROM;10 reps Other Exercises Other Exercises: shoulder press 10X Other Exercises: Proximal shoulder strengthening 10X each (rest breaks needed)           Pertinent Vitals/ Pain       Pain Assessment: No/denies pain         Frequency Min 2X/week     Progress Toward Goals  OT Goals(current goals can now be found in the care plan section)  Progress towards OT goals: Progressing toward goals  Acute Rehab OT Goals Patient Stated Goal: Return to home, regain strength   Plan Discharge plan remains appropriate       End of Session     Activity Tolerance Patient tolerated treatment well   Patient Left in bed;with call bell/phone within reach;with bed alarm set           Time: 4098-1191 OT Time Calculation (min): 42 min  Charges: OT General Charges $OT Visit: 1 Procedure  OT Treatments $Self Care/Home Management : 8-22 mins $Therapeutic Exercise: 23-37 mins  Ezra Sites, OTR/L  [phone]  04/12/2015, 9:17 AM

## 2015-04-13 DIAGNOSIS — I519 Heart disease, unspecified: Secondary | ICD-10-CM | POA: Insufficient documentation

## 2015-04-13 DIAGNOSIS — N170 Acute kidney failure with tubular necrosis: Secondary | ICD-10-CM | POA: Insufficient documentation

## 2015-04-13 DIAGNOSIS — I9589 Other hypotension: Secondary | ICD-10-CM | POA: Insufficient documentation

## 2015-04-13 DIAGNOSIS — I8310 Varicose veins of unspecified lower extremity with inflammation: Secondary | ICD-10-CM

## 2015-04-13 DIAGNOSIS — I248 Other forms of acute ischemic heart disease: Secondary | ICD-10-CM

## 2015-04-13 LAB — RENAL FUNCTION PANEL
ANION GAP: 11 (ref 5–15)
Albumin: 2.6 g/dL — ABNORMAL LOW (ref 3.5–5.0)
BUN: 92 mg/dL — ABNORMAL HIGH (ref 6–20)
CHLORIDE: 95 mmol/L — AB (ref 101–111)
CO2: 34 mmol/L — ABNORMAL HIGH (ref 22–32)
Calcium: 8.7 mg/dL — ABNORMAL LOW (ref 8.9–10.3)
Creatinine, Ser: 2.21 mg/dL — ABNORMAL HIGH (ref 0.61–1.24)
GFR, EST AFRICAN AMERICAN: 28 mL/min — AB (ref >60)
GFR, EST NON AFRICAN AMERICAN: 24 mL/min — AB (ref >60)
Glucose, Bld: 91 mg/dL (ref 65–99)
PHOSPHORUS: 4.5 mg/dL (ref 2.5–4.6)
Potassium: 4.6 mmol/L (ref 3.5–5.1)
SODIUM: 140 mmol/L (ref 135–145)

## 2015-04-13 LAB — CBC
HCT: 38.8 % — ABNORMAL LOW (ref 39.0–52.0)
HEMOGLOBIN: 12.8 g/dL — AB (ref 13.0–17.0)
MCH: 33.7 pg (ref 26.0–34.0)
MCHC: 33 g/dL (ref 30.0–36.0)
MCV: 102.1 fL — ABNORMAL HIGH (ref 78.0–100.0)
Platelets: 125 10*3/uL — ABNORMAL LOW (ref 150–400)
RBC: 3.8 MIL/uL — AB (ref 4.22–5.81)
RDW: 16.9 % — ABNORMAL HIGH (ref 11.5–15.5)
WBC: 5.8 10*3/uL (ref 4.0–10.5)

## 2015-04-13 MED ORDER — METOLAZONE 5 MG PO TABS
2.5000 mg | ORAL_TABLET | Freq: Once | ORAL | Status: AC
  Administered 2015-04-13: 2.5 mg via ORAL
  Filled 2015-04-13: qty 1

## 2015-04-13 NOTE — Progress Notes (Signed)
Occupational Therapy Treatment Patient Details Name: Calvin Rivas MRN: [id number] DOB: [birth year] Today's Date: 04/13/2015    History of present illness Pt is a 81 y.o. Male who lives at home alone who presented with gradual onset of weakness with recurrent falls, particularly in the 2 weeks prior to admission. The night prior to admission, he was at the church cleaning and fell and was unable to get up until he was found by church patrons the following morning. He has been hypotensive with oliguric AKI and demand ischemia. He is full code but previous MD had recommended DNR and he is currently still on floor status on midodrine with clinical improvement.    OT comments  Pt reports feeling tired this am, however was willing to participate in OT session. Pt willing to t/f from bed-chair, required increased time and min assist for bed mobility, mod assist for sit->stand t/f, and max verbal cuing for use of walker and hand placement. Pt completed grooming and toileting tasks this session. Pt easily fatigued this session, requiring multiple rest breaks & increased time during tasks, also required mod-max verbal cuing to remain on task. Pt progressing towards goals.    Follow Up Recommendations  SNF    Equipment Recommendations  None recommended by OT       Precautions / Restrictions Precautions Precautions: Fall Precaution Comments: Hx of falls Restrictions Weight Bearing Restrictions: No       Mobility Bed Mobility Overal bed mobility: Needs Assistance       Supine to sit: Min assist        Transfers Overall transfer level: Needs assistance Equipment used: Rolling walker (2 wheeled) Transfers: Sit to/from Stand Sit to Stand: Mod assist         General transfer comment: Pt completed bed->chair t/f with mod A using rolling walker. Max verbal cuing needed for correct use of walker and hand placement during t/f        ADL Overall ADL's : Needs assistance/impaired      Grooming: Oral care;Set up;Bed level                   Toilet Transfer: Modified Independent (urinal)   Toileting- Clothing Manipulation and Hygiene: Modified independent                          Cognition   Behavior During Therapy: WFL for tasks assessed/performed Overall Cognitive Status: Within Functional Limits for tasks assessed                                    Pertinent Vitals/ Pain       Pain Assessment: No/denies pain         Frequency Min 2X/week     Progress Toward Goals  OT Goals(current goals can now be found in the care plan section)  Progress towards OT goals: Progressing toward goals  Acute Rehab OT Goals Patient Stated Goal: Return to home, regain strength   Plan Discharge plan remains appropriate       End of Session Equipment Utilized During Treatment: Gait belt;Rolling walker   Activity Tolerance Patient tolerated treatment well   Patient Left in chair;with call bell/phone within reach;with chair alarm set           Time: 4098-1191 OT Time Calculation (min): 41 min  Charges: OT General Charges $OT Visit: 1 Procedure OT Treatments $  Self Care/Home Management : 38-52 mins  Ezra Sites, OTR/L  [phone] 04/13/2015, 12:45 PM

## 2015-04-13 NOTE — Progress Notes (Signed)
Subjective:  Breathing about the same. Doesn't notice any difference since yesterday.  Objective:  Vital Signs in the last 24 hours: Temp:  [98.1 F (36.7 C)-98.5 F (36.9 C)] 98.5 F (36.9 C) (07/06 0548) Pulse Rate:  [72-79] 72 (07/06 0548) Resp:  [18-20] 18 (07/06 0548) BP: (111-133)/(60) 111/60 mmHg (07/06 0548) SpO2:  [93 %-97 %] 97 % (07/06 0548) Weight:  [119 lb 14.4 oz (54.386 kg)] 119 lb 14.4 oz (54.386 kg) (07/06 0548)  Intake/Output from previous day: 07/05 0701 - 07/06 0700 In: 720 [P.O.:720] Out: 2725 [Urine:2725] Intake/Output from this shift:    Physical Exam: NECK: Increased JVD, HJR, no bruit LUNGS: Decreased breath sounds with crackles at the bases. HEART: Regular rate and rhythm, no murmur, gallop, rub, bruit, thrill, or heave EXTREMITIES: Without cyanosis, clubbing, or edema   Lab Results:  Recent Labs  04/12/15 0620 04/13/15 0637  WBC 5.4 5.8  HGB 12.0* 12.8*  PLT 106* 125*    Recent Labs  04/12/15 0620 04/13/15 0637  NA 140 140  K 4.5 4.6  CL 98* 95*  CO2 32 34*  GLUCOSE 95 91  BUN 95* 92*  CREATININE 2.30* 2.21*   No results for input(s): TROPONINI in the last 72 hours.  Invalid input(s): CK, MB Hepatic Function Panel  Recent Labs  04/13/15 0637  ALBUMIN 2.6*   No results for input(s): CHOL in the last 72 hours. No results for input(s): PROTIME in the last 72 hours.     Cardiac Studies: 1. Echocardiogram 11/27/2013 Study data: Technically adequate study. - Left ventricle: The cavity size was normal. Wall thickness   was increased in a pattern of moderate LVH. Systolic   function was severely reduced. The estimated ejection   fraction was in the range of 20% to 25%. Findings   consistent with left ventricular diastolic dysfunction,   grade indeterminant. There is evidence of elevated LA   pressure (E/e' 18). - Regional wall motion abnormality: Hypokinesis of the   basal-mid anteroseptal, mid inferoseptal, and apical   septal myocardium. - Aortic valve: Moderately calcified annulus. Trileaflet;   mildly thickened leaflets. Valve area: 1.85cm^2(VTI).   Valve area: 1.76cm^2 (Vmax). - Mitral valve: Mildly calcified annulus. Mildly thickened   leaflets . Mild regurgitation. - Left atrium: The atrium was moderately dilated. - Right ventricle: The cavity size was moderately to   severely dilated. Systolic function was severely reduced.   RV TAPSE is 0.6 cm. - Right atrium: The atrium was moderately to severely   dilated. - Pulmonary arteries: Systolic pressure was moderately   increased in the setting of severe RV dysfunction. PA peak   pressure: 60mm Hg (S). - Inferior vena cava: The vessel was dilated; the   respirophasic diameter changes were blunted (< 50%);   findings are consistent with elevated central venous   pressure.  Assessment/Plan:  . Ischemic Cardiomyopathy with CHF:  EF 20-25% Diuresed 2005 cc yesterday after 2.5 mg of Zaroxolyn.He is on lasix 60 mg IV BID, midodrine is also on board due to hypotension. Creatinine is 2.21  2. Hypotension: He is stable currently. ACE, BB have been discontinued.   3. Bradycardia: Resolved, Not on telemetry.   4. DNR                 LOS: 9 days    Jacolyn Reedy 04/13/2015, 7:50 AM   The patient was seen and examined, and I agree with the assessment and plan as documented above, with modifications as noted below.  Doing well today. Denies chest pain/SOB at present. Diuresis somewhat limited by hypotension. However, has had 2 L output in last 24 hours with addition of one dose of 2.5 mg metolazone. Will repeat today and reassess tomorrow. Would continue IV Lasix 60 mg bid.   Creatinine gradually improving with further diuresis. He has end-stage heart failure with concomitant cardiorenal syndrome. SBP 111 mmHg at present.

## 2015-04-13 NOTE — Progress Notes (Signed)
Physical Therapy Treatment Patient Details Name: Calvin Rivas MRN: [id number] DOB: 09-29-1921 Today's Date: 04/13/2015    History of Present Illness Pt is a 81 y.o. Male who lives at home alone who presented with gradual onset of weakness with recurrent falls, particularly in the 2 weeks prior to admission. The night prior to admission, he was at the church cleaning and fell and was unable to get up until he was found by church patrons the following morning. He has been hypotensive with oliguric AKI and demand ischemia. He is full code but previous MD had recommended DNR and he is currently still on floor status on midodrine with clinical improvement.     PT Comments    Pt tolerating treatment session well, motivated and able to complete some of PT sesssion as planned, but strongly uninterested in mobility training secondary to malaise and pain in legs. Pt continues to make progress toward goals as evidenced by improved mobility and strength c therex. Pt's greatest limitation continues to be fatigue which continues to limit ability to perform all mobility at baseline function. Patient presenting with impairment of strength, pain, balance, and activity tolerance, limiting ability to perform ADL and mobility tasks at  baseline level of function. Patient will benefit from skilled intervention to address the above impairments and limitations, in order to restore to prior level of function, improve patient safety upon discharge, and to decrease caregiver burden.    Follow Up Recommendations  SNF     Equipment Recommendations  Rolling walker with 5" wheels    Recommendations for Other Services       Precautions / Restrictions Precautions Precautions: Fall Precaution Comments: Hx of falls Restrictions Weight Bearing Restrictions: No    Mobility  Bed Mobility Overal bed mobility: Needs Assistance       Supine to sit: Min assist     General bed mobility comments: Received in chair  c nursing in room.   Transfers Overall transfer level: Needs assistance Equipment used: Rolling walker (2 wheeled) Transfers: Sit to/from Stand Sit to Stand: Mod assist         General transfer comment: Chair exercises only secondary to pain poor tolerance to additional actiivty.   Ambulation/Gait                 Stairs            Wheelchair Mobility    Modified Rankin (Stroke Patients Only)       Balance                                    Cognition Arousal/Alertness: Awake/alert Behavior During Therapy: WFL for tasks assessed/performed Overall Cognitive Status: Within Functional Limits for tasks assessed                      Exercises General Exercises - Lower Extremity Long Arc Quad: AROM;Strengthening;Both;15 reps;Seated Hip Flexion/Marching: AROM;Strengthening;Seated;Both;15 reps Heel Raises: AROM;Seated;Strengthening;Both;15 reps Shoulder Exercises Shoulder Flexion: AROM;Strengthening;Both;10 reps;Seated Elbow Flexion: AROM;Strengthening;Seated;10 reps (manually resisted rows) Elbow Extension: AROM;Strengthening;Seated;10 reps (manually resisted chest press. )    General Comments        Pertinent Vitals/Pain Pain Assessment:  (Pt reports pain in his right leg, but does not further qualify. He cites said pain to decline additional ambulation during today session. ) Pain Intervention(s): Limited activity within patient's tolerance;Monitored during session    Home Living  Prior Function            PT Goals (current goals can now be found in the care plan section) Acute Rehab PT Goals Patient Stated Goal: Return to home, regain strength  PT Goal Formulation: With patient Time For Goal Achievement: 04/20/15 Potential to Achieve Goals: Fair Progress towards PT goals: Progressing toward goals    Frequency  Min 3X/week    PT Plan Current plan remains appropriate    Co-evaluation              End of Session   Activity Tolerance: Patient limited by fatigue;Patient limited by pain Patient left: in chair;with call bell/phone within reach;with chair alarm set     Time: 1610-9604 PT Time Calculation (min) (ACUTE ONLY): 11 min  Charges:  $Therapeutic Exercise: 8-22 mins                    G Codes:      Buccola,Allan C Apr 20, 2015, 2:42 PM 2:43 PM  Rosamaria Lints, PT, DPT Standard License # [id number]

## 2015-04-13 NOTE — Clinical Social Work Note (Signed)
CSW spoke with [name] at [facility] and provided update.  CSW discussed with [name] that discharge on tomorrow is being discussed.  [name] was agreeable to take patient tomorrow should he be discharge.   [name], [place] [phone]

## 2015-04-13 NOTE — Progress Notes (Signed)
Subjective: Patient offers no complaint. He denies any nausea or vomiting.  Objective: Vital signs in last 24 hours: Temp:  [98.1 F (36.7 C)-98.5 F (36.9 C)] 98.5 F (36.9 C) (07/06 0548) Pulse Rate:  [72-79] 72 (07/06 0548) Resp:  [18-20] 18 (07/06 0548) BP: (111-133)/(60) 111/60 mmHg (07/06 0548) SpO2:  [93 %-97 %] 97 % (07/06 0548) Weight:  [119 lb 14.4 oz (54.386 kg)] 119 lb 14.4 oz (54.386 kg) (07/06 0548)  Intake/Output from previous day: 07/05 0701 - 07/06 0700 In: 720 [P.O.:720] Out: 2725 [Urine:2725] Intake/Output this shift: Total I/O In: 120 [P.O.:120] Out: 100 [Urine:100]   Recent Labs  04/11/15 0557 04/12/15 0620 04/13/15 0637  HGB 11.5* 12.0* 12.8*    Recent Labs  04/12/15 0620 04/13/15 0637  WBC 5.4 5.8  RBC 3.56* 3.80*  HCT 36.3* 38.8*  PLT 106* 125*    Recent Labs  04/12/15 0620 04/13/15 0637  NA 140 140  K 4.5 4.6  CL 98* 95*  CO2 32 34*  BUN 95* 92*  CREATININE 2.30* 2.21*  GLUCOSE 95 91  CALCIUM 8.1* 8.7*   No results for input(s): LABPT, INR in the last 72 hours.  Generally patient is alert and sitting up in the chair Chest: Decreased breath sound no rales or rhonchi His heart exam revealed regular rate and rhythm no murmur Extremities he has 1+ edema.  Assessment/Plan: Problem #1 acute kidney injury possibly ATN/ACE inhibitor. His BUN is 92 and creatinine is 2.1 continued to improve slowly.  Problem #2 chronic renal failure stage III. Asymptomatic  Problem #3 hypotension: Patient is started on Midodrine his blood pressure seems to be somewhat better. Problem #4 CHF. Patient with cardiomyopathy with ejection fraction of 20-25%. Patient presently on IV Lasix and he has 2700 mL output the last 24 hours and seems to be improving. Problem #5 Metabolic bone  disease: His calcium and phosphorus is range Problem #6 thrombocytopenia Problem#7 High  potassium ; His potassium is normal Problem#8 Metabolic bone disease : His calcium  and phosphorus is in range Plan: 1] continue his present treatment 2] BMP in am   [facility] S 04/13/2015, 10:36 AM

## 2015-04-13 NOTE — Progress Notes (Signed)
Daily Progress Note   Patient Name: Calvin Rivas       Date: 04/13/2015 DOB: September 18, 1921  Age: 81 y.o. MRN#: [id number] Attending Physician: Calvin Arms, MD Primary Care Physician: Calvin Bickers, MD Admit Date: 04/04/2015  Reason for Consultation/Follow-up: Establishing goals of care and Psychosocial/spiritual support  Subjective: Calvin Rivas states " I don't know what I'm doing",  And " I'm confused".  He states he thinks he is still in the hospital today because his legs are still swelling.  He states he has not had much PT, and isn't sleeping well at night.  He complains that he is woken at 6 am for meds and this is disruptive to his rest.  He thinks we are in [place] and its June, but otherwise has correct answers for abbreviated mental status exam.  He may benefit from a small dose of anxiolytic as a sleep aid.   Interval Events: Calvin Rivas continues with diuresis, and now wound care for his right lower leg.    Length of Stay: 9 days  Current Medications: Scheduled Meds:  . feeding supplement (ENSURE ENLIVE)  237 mL Oral BID BM  . furosemide  60 mg Intravenous BID  . heparin  5,000 Units Subcutaneous 3 times per day  . midodrine  5 mg Oral TID WC  . sodium chloride  3 mL Intravenous Q12H    Continuous Infusions:    PRN Meds: acetaminophen **OR** acetaminophen, ondansetron **OR** ondansetron (ZOFRAN) IV, oxyCODONE  Palliative Performance Scale: 40%     Vital Signs: BP 111/60 mmHg  Pulse 72  Temp(Src) 98.5 F (36.9 C) (Oral)  Resp 18  Ht  (1.651 m)  Wt 54.386 kg (119 lb 14.4 oz)  BMI 19.95 kg/m2  SpO2 97% SpO2: SpO2: 97 % O2 Device: O2 Device: Not Delivered O2 Flow Rate:    Intake/output summary:  Intake/Output Summary (Last 24 hours) at 04/13/15 1328 Last data filed at 04/13/15 1301  Gross per 24 hour  Intake    600 ml  Output   2775 ml  Net  -2175 ml   LBM:   Baseline Weight: Weight: 58.968 kg (130 lb) Most recent weight: Weight: 54.386 kg  (119 lb 14.4 oz)  Physical Exam: Gen: elderly frail, sitting in geri-chair, makes but does not keep eye contact.  Resp:  Even and non labored.  GI:  abd soft rounded, non tender.  Musc:  Generalized weakness   Additional Data Reviewed: Recent Labs     04/12/15  0620  04/13/15  0637  WBC  5.4  5.8  HGB  12.0*  12.8*  PLT  106*  125*  NA  140  140  BUN  95*  92*  CREATININE  2.30*  2.21*     Problem List:  Patient Active Problem List   Diagnosis Date Noted  . ATN (acute tubular necrosis)   . Demand ischemia   . Severe left ventricular systolic dysfunction   . Other specified hypotension   . Acute renal failure syndrome   . Non-traumatic rhabdomyolysis   . Acute on chronic systolic congestive heart failure   . Palliative care encounter 04/06/2015  . DNR (do not resuscitate) discussion   . Malnutrition of moderate degree 04/05/2015  . Acute renal failure 04/04/2015  . 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 AKI (acute kidney injury) 04/04/2015  . Rhabdomyolysis 04/04/2015  . Physical deconditioning 04/04/2015  . Acute systolic congestive heart failure 04/04/2015  . HLD (hyperlipidemia) 04/04/2015  . Venous  stasis dermatitis 04/04/2015  . Falls 04/04/2015  . Generalized weakness 04/04/2015  . Bradycardia 04/04/2015  . Elevated troponin   . Cardiomyopathy 11/28/2013  . Systolic and diastolic CHF, acute 11/28/2013  . Acute CHF (congestive heart failure) 11/26/2013  . CKD (chronic kidney disease), stage III 11/26/2013  . Hypernatremia 11/26/2013  . Thrombocytopenia, unspecified 11/26/2013  . Pedal edema 11/26/2013  . Fall 08/25/2013  . Multiple fractures of ribs of left side 08/25/2013  . Traumatic subarachnoid hemorrhage 08/25/2013  . Traumatic intraventricular hemorrhage, grade I 08/25/2013  . Acute blood loss anemia 08/25/2013  . Acute kidney injury 08/25/2013  . Hypertension   . Hypercholesterolemia   . Reflux      Palliative Care Assessment & Plan     Code Status:  DNR  Goals of Care:  Calvin Rivas now states he doesn't care "one way or the other" if he goes to rehab or stays in the hospital.    Desire for further Chaplaincy support:Not discussed today.   3. Symptom Management: Pain:  Oxycodone IR 5 mg Q 4 hours PRN, not used since admit Anxiety: May benefit from anxiolytic QHD as sleep aid.   4. Palliative Prophylaxis:  Stool Softner: NONE, suggest Senna-S 2 tabs BID if needed.   5. Prognosis: Unable to determine  5. Discharge Planning: Skilled Nursing Facility for rehab with Palliative care service follow-up  Calvin Rivas would benefit for transfer to [facility] in order to normalize his daily routine, increase his PT/OT, and provide more social interactions.     Care plan was discussed with Nursing, and CM.   Thank you for allowing the Palliative Medicine Team to assist in the care of this patient.   Time In:  1305 Time Out: 1350 Total Time 45 minutes Prolonged Time Billed  no     Greater than 50%  of this time was spent counseling and coordinating care related to the above assessment and plan.   Calvin Awe, NP  04/13/2015, 1:28 PM  Please contact Palliative Medicine Team phone at [phone] for questions and concerns.

## 2015-04-13 NOTE — Progress Notes (Signed)
[facility] PROGRESS NOTE  NICKOLAUS BORDELON ZOX:[id number] DOB: [birth year] DOA: 04/04/2015 PCP: Glori Bickers, MD  Brief Summary  Calvin Rivas is a 81 y.o. Male who lives at home alone who presented with gradual onset of weakness with recurrent falls, particularly in the 2 weeks prior to admission.  The night prior to admission, he was at the church cleaning and fell and was unable to get up until he was found by church patrons the following morning.  He had been hypotensive with oliguric AKI and demand ischemia.  He was full code but after meeting with palliative care has been made DO NOT RESUSCITATE. He is uncertain on Midrin for hypotension. Cardiology and nephrology are following. He is now on intravenous Lasix and metolazone for acute on chronic systolic heart failure. Renal failure is also improving with diuresis. Once he has been adequately diuresed and blood pressure stable, he can be discharged to skilled nursing facility.    Assessment/Plan  Acute on CKD stage 3, baseline creatinine 1.6 suspect ARF from pre-renal azotemia, ACE inhibitor, and Rhabdomyolysis.  Creatinine is improving, creatinine is 2.2 today -  Continue to hold all antihypertensives -  Appreciate nephrology assistanc -  FENa 11% -  RUS with medical renal disease -  Continue diuretic, managed by nephrology, -2005 CC over last 24 hours.   Hypotension, persistent, likely related to low EF -  Remains afebrile - Does not appear to be septic/toxic, is rather asymptomatic. Denies dizziness or light headedness, is mentating appropriately -  Continue to hold all antihypertensives -  On midodrine -  Palliative care consultation appreciated  Sinus Bradycardia:  suspect secondary to excessive use of Metoprolol. -  Appreciate cardiology assistance -  Hold all anti-hypertensives -  TSH  4.254 -  Improved   Acute on Chronic Systolic CHF:  EF 20-25% 11/2013 ECHO.  Legs have developed bullae and are still swollen. -   Urine output is approximately stable -  Per cardiology, not a candidate for PPM -  No ACEI or BB due to bradycardia and hypotension - currently on intravenous lasix. Metolazone added to regimen today.  Rhabdomyolysis: secondary to prolonged immobilization, CK trended down  Generalized weakness: probably secondary to above noted issues.  -  PT/OT eval recommending SNF placement  Mechanical fall:  occured on Saturday.  Denies syncope. Likely secondary to hypotension from incorrectly taking medications.  See above  Thrombocytopenia:  seems to be a chronic issue. Stable.  Chronic Lower Ext edema: uses Unna boots to control edema.  -  Legs are still too swollen to place Unna boot when able - appreciate wound care consult  Social Issues:  lives alone, question whether patient is taking excessive medications inadvertantly, frequent falls-suspect will need SNF on discharge.  -  Social work consult appreciated  Moderate protein calorie malnutrition -  Appreciate nutrition assistance -  Continue ensure  Diet:  Low sodium Access:  PIV IVF:  off Proph:  heparin  Code Status: DNR Family Communication: Discussed with patient, none at bedside. Disposition Plan:  SNF placement    Consultants:  Cardiology  Nephrology  Palliative care  Procedures:  X-ray hips on 6/27: Indeterminate age of an inferior pubic ramus fracture on the left, degenerative narrowing of bilateral hips  Chest x-ray: Cardiomegaly with increased interstitial densities and pulmonary edema, left pleural effusion which is stable from prior  CT head on 6/27: Mild diffuse atrophy  Antibiotics:  None   HPI/Subjective: Feels tired today. Did not sleep well. No chest  pain or shortness of breath  Objective: Filed Vitals:   04/12/15 0541 04/12/15 1449 04/12/15 2159 04/13/15 0548  BP: 100/60 125/60 133/60 111/60  Pulse: 79 79 74 72  Temp: 98.5 F (36.9 C) 98.1 F (36.7 C) 98.3 F (36.8 C) 98.5 F (36.9 C)   TempSrc: Oral  Oral Oral  Resp: Height:      Weight: 54.432 kg (120 lb)   54.386 kg (119 lb 14.4 oz)  SpO2: 95% 94% 93% 97%    Intake/Output Summary (Last 24 hours) at 04/13/15 0928 Last data filed at 04/13/15 0552  Gross per 24 hour  Intake    480 ml  Output   2525 ml  Net  -2045 ml   Filed Weights   04/11/15 0641 04/12/15 0541 04/13/15 0548  Weight: 58.6 kg (129 lb 3 oz) 54.432 kg (120 lb) 54.386 kg (119 lb 14.4 oz)    Exam:   General:  Thin male, No acute distress, sitting up in chair  HEENT:  NCAT, MMM  Cardiovascular:  RRR, 2+ pulses, warm extremities,  Respiratory:   Crackles at bases, normal respiratory effort  Abdomen:   NABS, soft, NT/ND  MSK:   Normal tone and bulk, 1+ pitting bilateral LEE, improving.  Large bullae on right lower extremity draining clear fluid. There is surrounding mild erythema.  Psych: Alert and aware of the plan of care, sharp  Data Reviewed: Basic Metabolic Panel:  Recent Labs Lab 04/09/15 0604 04/10/15 0552 04/11/15 0557 04/12/15 0620 04/13/15 0637  NA 139 141 139 140 140  K 5.2* 4.7 4.6 4.5 4.6  CL 100* 101 99* 98* 95*  CO2 29 31 32 32 34*  GLUCOSE 94 92 95 95 91  BUN 92* 91* 93* 95* 92*  CREATININE 2.78* 2.68* 2.46* 2.30* 2.21*  CALCIUM 8.2* 7.9* 7.9* 8.1* 8.7*  PHOS 3.8 4.1 3.7  3.7 4.1 4.5   Liver Function Tests:  Recent Labs Lab 04/09/15 0604 04/10/15 0552 04/11/15 0557 04/12/15 0620 04/13/15 0637  ALBUMIN 2.9* 2.6* 2.6* 2.7* 2.6*   No results for input(s): LIPASE, AMYLASE in the last 168 hours. No results for input(s): AMMONIA in the last 168 hours. CBC:  Recent Labs Lab 04/09/15 0604 04/10/15 0552 04/11/15 0557 04/12/15 0620 04/13/15 0637  WBC 7.7 5.6 5.6 5.4 5.8  HGB 13.7 11.8* 11.5* 12.0* 12.8*  HCT 42.1 36.7* 35.2* 36.3* 38.8*  MCV 102.2* 103.1* 102.3* 102.0* 102.1*  PLT 123* 118* 113* 106* 125*   Cardiac Enzymes: No results for input(s): CKTOTAL, CKMB, CKMBINDEX, TROPONINI  in the last 168 hours. BNP (last 3 results)  Recent Labs  04/04/15 1632  BNP 2864.0*    ProBNP (last 3 results) No results for input(s): PROBNP in the last 8760 hours.  CBG: No results for input(s): GLUCAP in the last 168 hours.  Recent Results (from the past 240 hour(s))  Urine culture     Status: None   Collection Time: 04/04/15  5:30 PM  Result Value Ref Range Status   Specimen Description URINE, CLEAN CATCH  Final   Special Requests NONE  Final   Culture   Final    MULTIPLE SPECIES PRESENT, SUGGEST RECOLLECTION IF CLINICALLY INDICATED Performed at [facility]    Report Status 04/06/2015 FINAL  Final     Studies: No results found.  Scheduled Meds: . feeding supplement (ENSURE ENLIVE)  237 mL Oral BID BM  . furosemide  60 mg Intravenous BID  . heparin  5,000 Units Subcutaneous 3 times per day  . midodrine  5 mg Oral TID WC  . sodium chloride  3 mL Intravenous Q12H   Continuous Infusions:    Active Problems:   Acute renal failure   Cardiovascular renal disease   AKI (acute kidney injury)   Rhabdomyolysis   Physical deconditioning   Acute systolic congestive heart failure   HLD (hyperlipidemia)   Venous stasis dermatitis   Falls   Generalized weakness   Bradycardia   Malnutrition of moderate degree   Palliative care encounter   DNR (do not resuscitate) discussion   Acute on chronic systolic congestive heart failure   Acute renal failure syndrome   Non-traumatic rhabdomyolysis    Time spent: 30 min    ELGERGAWY, DAWOOD  [facility] Pager [phone]. If 7PM-7AM, please contact night-coverage at [email or web address], password [facility] 04/13/2015, 9:28 AM  LOS: 9 days

## 2015-04-14 DIAGNOSIS — I5021 Acute systolic (congestive) heart failure: Secondary | ICD-10-CM

## 2015-04-14 LAB — CBC
HEMATOCRIT: 36.5 % — AB (ref 39.0–52.0)
HEMOGLOBIN: 12 g/dL — AB (ref 13.0–17.0)
MCH: 33.6 pg (ref 26.0–34.0)
MCHC: 32.9 g/dL (ref 30.0–36.0)
MCV: 102.2 fL — ABNORMAL HIGH (ref 78.0–100.0)
Platelets: 122 10*3/uL — ABNORMAL LOW (ref 150–400)
RBC: 3.57 MIL/uL — ABNORMAL LOW (ref 4.22–5.81)
RDW: 16.9 % — ABNORMAL HIGH (ref 11.5–15.5)
WBC: 5.8 10*3/uL (ref 4.0–10.5)

## 2015-04-14 LAB — RENAL FUNCTION PANEL
ALBUMIN: 2.6 g/dL — AB (ref 3.5–5.0)
Anion gap: 11 (ref 5–15)
BUN: 101 mg/dL — ABNORMAL HIGH (ref 6–20)
CO2: 36 mmol/L — AB (ref 22–32)
Calcium: 8.4 mg/dL — ABNORMAL LOW (ref 8.9–10.3)
Chloride: 92 mmol/L — ABNORMAL LOW (ref 101–111)
Creatinine, Ser: 2.38 mg/dL — ABNORMAL HIGH (ref 0.61–1.24)
GFR calc Af Amer: 26 mL/min — ABNORMAL LOW (ref >60)
GFR calc non Af Amer: 22 mL/min — ABNORMAL LOW (ref >60)
GLUCOSE: 103 mg/dL — AB (ref 65–99)
PHOSPHORUS: 3.8 mg/dL (ref 2.5–4.6)
POTASSIUM: 4.3 mmol/L (ref 3.5–5.1)
Sodium: 139 mmol/L (ref 135–145)

## 2015-04-14 LAB — MAGNESIUM: Magnesium: 2.2 mg/dL (ref 1.7–2.4)

## 2015-04-14 MED ORDER — ENSURE ENLIVE PO LIQD: 237.0000 mL | Freq: Two times a day (BID) | ORAL | Status: AC

## 2015-04-14 MED ORDER — MIDODRINE HCL 5 MG PO TABS: 5.0000 mg | ORAL_TABLET | Freq: Three times a day (TID) | ORAL | Status: AC

## 2015-04-14 MED ORDER — TORSEMIDE 20 MG PO TABS: 40.0000 mg | ORAL_TABLET | Freq: Every day | ORAL | Status: AC

## 2015-04-14 MED ORDER — ACETAMINOPHEN 325 MG PO TABS: 650.0000 mg | ORAL_TABLET | Freq: Four times a day (QID) | ORAL | Status: AC | PRN

## 2015-04-14 MED ORDER — TORSEMIDE 20 MG PO TABS
40.0000 mg | ORAL_TABLET | Freq: Every day | ORAL | Status: DC
  Administered 2015-04-14: 40 mg via ORAL
  Filled 2015-04-14: qty 2

## 2015-04-14 MED ORDER — OXYCODONE HCL 5 MG PO TABS: 5.0000 mg | ORAL_TABLET | ORAL | Status: AC | PRN

## 2015-04-14 NOTE — Progress Notes (Signed)
Report called to [place] at [facility].  Awaiting arrival of ride.  Packet ready per SW.

## 2015-04-14 NOTE — Clinical Social Work Placement (Signed)
   CLINICAL SOCIAL WORK PLACEMENT  NOTE  Date:  04/14/2015  Patient Details  Name: Calvin Rivas MRN: [id number] Date of Birth: 1921-02-13  Clinical Social Work is seeking post-discharge placement for this patient at the Skilled  Nursing Facility level of care (*CSW will initial, date and re-position this form in  chart as items are completed):      Patient/family provided with [facility] Clinical Social Work Department's list of facilities offering this level of care within the geographic area requested by the patient (or if unable, by the patient's family).  Yes   Patient/family informed of their freedom to choose among providers that offer the needed level of care, that participate in Medicare, Medicaid or managed care program needed by the patient, have an available bed and are willing to accept the patient.  Yes   Patient/family informed of [facility]'s ownership interest in [facility] and [facility], as well as of the fact that they are under no obligation to receive care at these facilities.  PASRR submitted to EDS on       PASRR number received on       Existing PASRR number confirmed on       FL2 transmitted to all facilities in geographic area requested by pt/family on 04/07/15     FL2 transmitted to all facilities within larger geographic area on       Patient informed that his/her managed care company has contracts with or will negotiate with certain facilities, including the following:        Yes   Patient/family informed of bed offers received.  Patient chooses bed at [facility]     Physician recommends and patient chooses bed at      Patient to be transferred to [facility] on 04/14/15.  Patient to be transferred to facility by friend     Patient family notified on 04/14/15 of transfer.  Name of family member notified:  Lupita Leash- niece     PHYSICIAN       Additional Comment:     _______________________________________________ Karn Cassis, LCSW 04/14/2015, 10:20 AM [phone]

## 2015-04-14 NOTE — Discharge Summary (Addendum)
Calvin Rivas, is a 81 y.o. male  DOB Feb 14, 1921  MRN [id number].  Admission date:  04/04/2015  Admitting Physician  Ozella Rocks, MD  Discharge Date:  04/14/2015   Primary MD  Glori Bickers, MD  Recommendations for primary care physician/skilled nursing facility physician for things to follow:  - please check labs including CBC, BMP in 3 days.   Admission Diagnosis  Orthostatic hypotension [I95.1] Thrombocytopenia [D69.6] Prolonged Q-T interval on ECG [I45.81] Fall [W19.XXXA] Elevated troponin [R79.89] Multiple falls [R29.6] Non-traumatic rhabdomyolysis [M62.82] Acute renal failure, unspecified acute renal failure type [N17.9] Chronic congestive heart failure, unspecified congestive heart failure type [I50.9]   Discharge Diagnosis  Orthostatic hypotension [I95.1] Thrombocytopenia [D69.6] Prolonged Q-T interval on ECG [I45.81] Fall [W19.XXXA] Elevated troponin [R79.89] Multiple falls [R29.6] Non-traumatic rhabdomyolysis [M62.82] Acute renal failure, unspecified acute renal failure type [N17.9] Chronic congestive heart failure, unspecified congestive heart failure type [I50.9]    Active Problems:   Acute renal failure   Cardiovascular renal disease   AKI (acute kidney injury)   Rhabdomyolysis   Physical deconditioning   Acute systolic congestive heart failure   HLD (hyperlipidemia)   Venous stasis dermatitis   Falls   Generalized weakness   Bradycardia   Malnutrition of moderate degree   Palliative care encounter   DNR (do not resuscitate) discussion   Acute on chronic systolic congestive heart failure   Acute renal failure syndrome   Non-traumatic rhabdomyolysis   ATN (acute tubular necrosis)   Demand ischemia   Severe left ventricular systolic dysfunction   Other specified hypotension      Past Medical History  Diagnosis Date  . Essential hypertension   .  Hypercholesterolemia   . GERD (gastroesophageal reflux disease)   . History of arm fracture   . History of fracture of leg   . Chronic systolic heart failure   . Cardiomyopathy     LVEF 20-25%    Past Surgical History  Procedure Laterality Date  . No past surgeries         History of present illness and  Hospital Course:     Kindly see H&P for history of present illness and admission details, please review complete Labs, Consult reports and Test reports for all details in brief  HPI  from the history and physical done on the day of admission on 6/27 81 y.o. male  Gradual onset general weakness with falls. Weakness is constant and getting worse. Patient reports numerous falls over the last 2 weeks with the most significant occurring Saturday night. Patient states he was cleaning a church and fell and was unable to get up until the following morning when he was assisted by [employer]. Patient denies striking his head or loss of consciousness or symptoms of dizziness and describes the falls as simply losing balance. Patient lives by himself. Patient is accompanied today by his niece, who came down from [place] to bring patient to the hospital. Of note patient had an right sided Unna boot in place which was removed  in the ED. This was for chronic venous stasis wounds of the right lower 70. Denies chest pain shortness of breath, nausea, vomiting, abdominal pain, LOC, diarrhea, constipation, dysuria, frequency, back pain from the neck stiffness, headache, loss of appetite, cough, congestion, dysphagia, melena, hematochezia, hemoptysis, palpitations.   Hospital Course  Calvin Rivas is a 81 y.o. Male who lives at home alone who presented with gradual onset of weakness with recurrent falls, particularly in the 2 weeks prior to admission. The night prior to admission, he was at the church cleaning and fell and was unable to get up until he was found by church patrons the following  morning. He had been hypotensive with oliguric AKI and demand ischemia. He was full code but after meeting with palliative care has been made DO NOT RESUSCITATE. He is uncertain on Midrin for hypotension. Cardiology and nephrology are following. He is now on intravenous Lasix and metolazone for acute on chronic systolic heart failure. Renal failure is also improving with diuresis.  he has been adequately diuresed and blood pressure stable, he can be discharged to skilled nursing facility  Acute on CKD stage 3, baseline creatinine 1.6 suspect ARF from pre-renal azotemia, ACE inhibitor, and Rhabdomyolysis. Creatinine is improving, creatinine is 2.3 on discharge, peaked at 2.9 - Continue to hold all antihypertensives - FENa 11% - RUS with medical renal disease - Diuresis was managed by nephrology, -7.734 during hospital stay, weight 58.8 on admission, 51.6 on discharge - We will discharge on torsemide 40 mg oral daily.   Hypotension, persistent, likely related to low EF - Remains afebrile - Does not appear to be septic/toxic, is rather asymptomatic. Denies dizziness or light headedness, is mentating appropriately - Continue to hold all antihypertensives - Improved On midodrine - Palliative care consultation appreciated  Sinus Bradycardia: suspect secondary to excessive use of Metoprolol. - Appreciate cardiology assistance - Hold all anti-hypertensives - TSH 4.254 - Improved   Acute on Chronic Systolic CHF: EF 16-10% 11/2013 ECHO. Legs have developed bullae and are still swollen. - Urine output is approximately stable - Per cardiology, not a candidate for PPM - No ACEI or BB due to bradycardia and hypotension - We'll discharge on torsemide  Rhabdomyolysis: secondary to prolonged immobilization, CK trended down  Generalized weakness: probably secondary to above noted issues.  - PT/OT eval recommending SNF placement  Mechanical fall: occured on Saturday. Denies  syncope. Likely secondary to hypotension from incorrectly taking medications. See above  Thrombocytopenia: seems to be a chronic issue. Stable.  Chronic Lower Ext edema: uses Unna boots to control edema, edema has currently resolved. - appreciate wound care consult  Social Issues: lives alone, , frequent falls-suspect will SNF on discharge.  - Social work consult appreciated  Moderate protein calorie malnutrition - Appreciate nutrition assistance - Continue ensure    Discharge Condition: Stable   Follow UP  Follow-up Information    Follow up with TRIVEDI,RAJENDRA, MD On 04/19/2015.   Specialty:  Family Medicine   Why:  at 12:00 pm   Contact information:   [address] [place] [address] [phone]       Follow up with Joni Reining, NP On 04/28/2015.   Specialties:  Nurse Practitioner, Radiology, Cardiology   Why:  1:50 pm   Contact information:   [address] [place] [address] [phone]       Follow up with [facility], MD On 05/17/2015.   Specialty:  Nephrology   Why:  at 12:00 pm  Contact information:   [address] [place] [address] [phone]       Follow up with TRIVEDI,RAJENDRA, MD. Schedule an appointment as soon as possible for a visit in 1 week.   Specialty:  Family Medicine   Why:  Posthospitalization follow-up   Contact information:   [address] [place] [address] [phone]         Discharge Instructions  and  Discharge Medications         Discharge Instructions    Diet - low sodium heart healthy    Complete by:  As directed      Discharge instructions    Complete by:  As directed   Follow with Primary MD TRIVEDI,RAJENDRA, MD in 7 days   Get CBC, CMP, 2 view Chest X ray checked  by Primary MD next visit.    Activity: As tolerated with Full fall precautions use walker/cane & assistance as needed   Disposition Home **   Diet: Heart Healthy ** , with feeding  assistance and aspiration precautions.  For Heart failure patients - Check your Weight same time everyday, if you gain over 2 pounds, or you develop in leg swelling, experience more shortness of breath or chest pain, call your Primary MD immediately. Follow Cardiac Low Salt Diet and 1.5 lit/day fluid restriction.   On your next visit with your primary care physician please Get Medicines reviewed and adjusted.   Please request your Prim.MD to go over all Hospital Tests and Procedure/Radiological results at the follow up, please get all Hospital records sent to your Prim MD by signing hospital release before you go home.   If you experience worsening of your admission symptoms, develop shortness of breath, life threatening emergency, suicidal or homicidal thoughts you must seek medical attention immediately by calling 911 or calling your MD immediately  if symptoms less severe.  You Must read complete instructions/literature along with all the possible adverse reactions/side effects for all the Medicines you take and that have been prescribed to you. Take any new Medicines after you have completely understood and accpet all the possible adverse reactions/side effects.   Do not drive, operating heavy machinery, perform activities at heights, swimming or participation in water activities or provide baby sitting services if your were admitted for syncope or siezures until you have seen by Primary MD or a Neurologist and advised to do so again.  Do not drive when taking Pain medications.    Do not take more than prescribed Pain, Sleep and Anxiety Medications  Special Instructions: If you have smoked or chewed Tobacco  in the last 2 yrs please stop smoking, stop any regular Alcohol  and or any Recreational drug use.  Wear Seat belts while driving.   Please note  You were cared for by a hospitalist during your hospital stay. If you have any questions about your discharge medications or the care  you received while you were in the hospital after you are discharged, you can call the unit and asked to speak with the hospitalist on call if the hospitalist that took care of you is not available. Once you are discharged, your primary care physician will handle any further medical issues. Please note that NO REFILLS for any discharge medications will be authorized once you are discharged, as it is imperative that you return to your primary care physician (or establish a relationship with a primary care physician if you do not have one) for your aftercare needs  so that they can reassess your need for medications and monitor your lab values.     Increase activity slowly    Complete by:  As directed             Medication List    STOP taking these medications        clarithromycin 250 MG tablet  Commonly known as:  BIAXIN     furosemide 40 MG tablet  Commonly known as:  LASIX     lisinopril 2.5 MG tablet  Commonly known as:  PRINIVIL,ZESTRIL     Medical Compression Stockings Misc     metoprolol succinate 25 MG 24 hr tablet  Commonly known as:  TOPROL-XL      TAKE these medications        acetaminophen 325 MG tablet  Commonly known as:  TYLENOL  Take 2 tablets (650 mg total) by mouth every 6 (six) hours as needed for mild pain (or Fever >/= 101).     CALCIUM PO  Take 1 tablet by mouth daily.     feeding supplement (ENSURE ENLIVE) Liqd  Take 237 mLs by mouth 2 (two) times daily between meals.     midodrine 5 MG tablet  Commonly known as:  PROAMATINE  Take 1 tablet (5 mg total) by mouth 3 (three) times daily with meals.     oxyCODONE 5 MG immediate release tablet  Commonly known as:  Oxy IR/ROXICODONE  Take 1 tablet (5 mg total) by mouth every 4 (four) hours as needed for moderate pain.     simvastatin 40 MG tablet  Commonly known as:  ZOCOR  Take 1 tablet (40 mg total) by mouth every evening.     torsemide 20 MG tablet  Commonly known as:  DEMADEX  Take 2 tablets (40  mg total) by mouth daily.     Vitamin D (Ergocalciferol) 50000 UNITS Caps capsule  Commonly known as:  DRISDOL  Take 50,000 Units by mouth every 30 (thirty) days.          Diet and Activity recommendation: See Discharge Instructions above   Consults obtained - - Cardiology - Nephrology - Palliative care                     Major procedures and Radiology Reports - PLEASE review detailed and final reports for all details, in brief -      Dg Chest 1 View  04/04/2015   CLINICAL DATA:  Larey Seat from wheelchair at church 2 days ago. Initial encounter.  EXAM: CHEST  1 VIEW  COMPARISON:  November 26, 2013.  FINDINGS: Stable cardiomegaly. No pneumothorax is noted. Increased interstitial densities are noted throughout both lungs concerning for vascular congestion and pulmonary edema. Probable left pleural effusion is noted. Bony thorax is intact. Multilevel degenerative disc disease is noted in thoracic spine.  IMPRESSION: Cardiomegaly is noted with increased interstitial densities throughout both lungs concerning for pulmonary edema and possible congestive heart failure. Mild left pleural effusion is noted which appears to be increased compared to prior exam.   Electronically Signed   By: Lupita Raider, M.D.   On: 04/04/2015 17:19   Ct Head Wo Contrast  04/04/2015   CLINICAL DATA:  Weakness after fall from wheelchair 2 days ago.  EXAM: CT HEAD WITHOUT CONTRAST  TECHNIQUE: Contiguous axial images were obtained from the base of the skull through the vertex without intravenous contrast.  COMPARISON:  CT scan of August 24, 2013.  FINDINGS: Bony  calvarium appears intact. Mild diffuse cortical atrophy is noted. Mild chronic ischemic white matter disease is noted. No mass effect or midline shift is noted. Ventricular size is within normal limits. There is no evidence of mass lesion, hemorrhage or acute infarction. 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  IMPRESSION: Mild diffuse  cortical atrophy. Mild chronic ischemic white matter disease. No acute intracranial abnormality seen.   Electronically Signed   By: Lupita Raider, M.D.   On: 04/04/2015 17:34   US Renal  04/06/2015   CLINICAL DATA:  Acute tubular necrosis.  Hypertension  EXAM: RENAL / URINARY TRACT ULTRASOUND COMPLETE  COMPARISON:  None.  FINDINGS: Right Kidney:  Length: 10.3 cm. There is a cyst within the upper pole measuring 4.6 x 1.7 x 2.0 cm. Similar to previous exam. Increased parenchymal echogenicity. No mass or hydronephrosis visualized.  Left Kidney:  Length: 11 cm. Increased parenchymal echogenicity. No mass or hydronephrosis visualized.  Bladder:  Appears normal for degree of bladder distention.  IMPRESSION: 1. No acute findings. 2. Increased parenchymal echogenicity of the kidneys compatible with medical renal disease.   Electronically Signed   By: Signa Kell M.D.   On: 04/06/2015 09:16   Dg Hips Bilat With Pelvis 2v  04/04/2015   CLINICAL DATA:  Status post fall striking the buttocks on Saturday ; patient not found until Sunday ; patient reports no pain but is week  EXAM: BILATERAL HIP (WITH PELVIS) 2 VIEWS  COMPARISON:  None.  FINDINGS: The bony pelvis is osteopenic. There is deformity of the inferior pubic ramus on the left that is of uncertain age. The observed portions of the sacrum exhibit no acute abnormalities. The SI joints are unremarkable. There is symmetric narrowing of the hip joint spaces bilaterally. The femoral heads, necks, and intertrochanteric regions are normal.  IMPRESSION: Deformity of the inferior pubic ramus on the left that is of uncertain age. No acute fracture is demonstrated elsewhere. There is degenerative narrowing of the hip joint spaces bilaterally.  If there are strong clinical concerns that the left inferior pubic ramus abnormality is acute or that there is occult pathology elsewhere, pelvic MRI or CT scanning would be useful.   Electronically Signed   By: David  [place] M.D.    On: 04/04/2015 17:22    Micro Results    Recent Results (from the past 240 hour(s))  Urine culture     Status: None   Collection Time: 04/04/15  5:30 PM  Result Value Ref Range Status   Specimen Description URINE, CLEAN CATCH  Final   Special Requests NONE  Final   Culture   Final    MULTIPLE SPECIES PRESENT, SUGGEST RECOLLECTION IF CLINICALLY INDICATED Performed at [facility]    Report Status 04/06/2015 FINAL  Final       Today   Subjective:   Calvin Rivas today has no headache,no chest abdominal pain,no new weakness tingling or numbness, feels much better today.   Objective:   Blood pressure 95/40, pulse 71, temperature 98.7 F (37.1 C), temperature source Oral, resp. rate 18, height 5\' 5"  (1.651 m), weight 51.619 kg (113 lb 12.8 oz), SpO2 93 %.   Intake/Output Summary (Last 24 hours) at 04/14/15 0930 Last data filed at 04/13/15 2000  Gross per 24 hour  Intake    360 ml  Output   1276 ml  Net   -916 ml    Exam  General: Thin male, No acute distress, sitting up in chair  HEENT: NCAT, MMM  Cardiovascular: RRR, 2+ pulses, warm extremities,  Respiratory:Clear to auscultation, normal respiratory effort  Abdomen: NABS, soft, NT/ND  MSK: Normal tone and bulk, edema has totally resolved, bullae and lower extremity has drained   Psych: Alert and aware of the plan of care, sharp  Data Review   CBC w Diff:  Lab Results  Component Value Date   WBC 5.8 04/14/2015   HGB 12.0* 04/14/2015   HCT 36.5* 04/14/2015   PLT 122* 04/14/2015   LYMPHOPCT 10* 04/04/2015   MONOPCT 9 04/04/2015   EOSPCT 1 04/04/2015   BASOPCT 0 04/04/2015    CMP:  Lab Results  Component Value Date   NA 139 04/14/2015   K 4.3 04/14/2015   CL 92* 04/14/2015   CO2 36* 04/14/2015   BUN 101* 04/14/2015   CREATININE 2.38* 04/14/2015   PROT 6.1* 04/06/2015   ALBUMIN 2.6* 04/14/2015   BILITOT 1.5* 04/06/2015   ALKPHOS 89 04/06/2015   AST 67* 04/06/2015   ALT 39  04/06/2015  .   Total Time in preparing paper work, data evaluation and todays exam - 35 minutes  Dorreen Valiente M.D on 04/14/2015 at 9:30 AM  [facility]   Office  [phone]

## 2015-04-14 NOTE — Progress Notes (Signed)
Subjective: Patient continued to feel better. His appetite is good and no difficulty breathing.  Objective: Vital signs in last 24 hours: Temp:  [97.7 F (36.5 C)-98.7 F (37.1 C)] 98.7 F (37.1 C) (07/07 0602) Pulse Rate:  [58-71] 71 (07/07 0602) Resp:  [18] 18 (07/07 0602) BP: (93-102)/(35-54) 95/40 mmHg (07/07 0602) SpO2:  [93 %-98 %] 93 % (07/07 0602) Weight:  [113 lb 12.8 oz (51.619 kg)] 113 lb 12.8 oz (51.619 kg) (07/07 0602)  Intake/Output from previous day: 07/06 0701 - 07/07 0700 In: 360 [P.O.:360] Out: 1276 [Urine:1275; Stool:1] Intake/Output this shift:     Recent Labs  04/12/15 0620 04/13/15 0637 04/14/15 0703  HGB 12.0* 12.8* 12.0*    Recent Labs  04/13/15 0637 04/14/15 0703  WBC 5.8 5.8  RBC 3.80* 3.57*  HCT 38.8* 36.5*  PLT 125* 122*    Recent Labs  04/13/15 0637 04/14/15 0703  NA 140 139  K 4.6 4.3  CL 95* 92*  CO2 34* 36*  BUN 92* 101*  CREATININE 2.21* 2.38*  GLUCOSE 91 103*  CALCIUM 8.7* 8.4*   No results for input(s): LABPT, INR in the last 72 hours.  Generally patient is alert and sitting up in the chair Chest: Decreased breath sound no rales or rhonchi His heart exam revealed regular rate and rhythm no murmur Extremities he has trace edema.  Assessment/Plan: Problem #1 acute kidney injury possibly ATN/ACE inhibitor. His BUN and creatinine is slightly high today possibly secondary to fluid removal .   Problem #2 chronic renal failure stage III. Asymptomatic  Problem #3 hypotension: Patient is started on Midodrine his blood pressure is no normal. Problem #4 CHF. Patient with cardiomyopathy with ejection fraction of 20-25%. Patient presently on IV Lasix and he has 1200 mL output the last 24 hours . Patient CHF has improved significantly including his leg edema.  Problem #5 Metabolic bone  disease: His calcium and phosphorus is range Problem #6 thrombocytopenia Problem#7 High  potassium ; His potassium is normal Problem#8  Metabolic bone disease : His calcium and phosphorus is in range Plan: 1] Will DC IV Lasix 2] will start on Demadex 40 mg by mouth daily 3] will see patient history weeks once he is discharged.    Tomasina Keasling S 04/14/2015, 9:09 AM

## 2015-04-14 NOTE — Care Management Note (Addendum)
Case Management Note  Patient Details  Name: Calvin Rivas MRN: [id number] Date of Birth: 08-Oct-1921   Expected Discharge Date:                  Expected Discharge Plan:  Skilled Nursing Facility  In-House Referral:  Clinical Social Work  Discharge planning Services  CM Consult  Post Acute Care Choice:  NA Choice offered to:  NA  DME Arranged:    DME Agency:     HH Arranged:    HH Agency:     Status of Service:  Completed, signed off  Medicare Important Message Given:  Yes-third notification given Date Medicare IM Given:    Medicare IM give by:    Date Additional Medicare IM Given:    Additional Medicare Important Message give by:     If discussed at Long Length of Stay Meetings, dates discussed:  04/14/2015  Additional Comments: Pt discharging to SNF today. CSW is aware and has arranged for placement at [facility] in [place], [place]. Pt, family and RN aware of DC plan. No CM needs.  Malcolm Metro, RN 04/14/2015, 11:14 AM

## 2015-04-14 NOTE — Care Management (Signed)
Important Message  Patient Details  Name: Calvin Rivas MRN: [id number] Date of Birth: 1920/10/17   Medicare Important Message Given:  Yes-third notification given    Malcolm Metro, RN 04/14/2015, 11:14 AM

## 2015-04-28 ENCOUNTER — Encounter: Payer: Self-pay | Admitting: Adult Health

## 2015-04-28 ENCOUNTER — Encounter: Payer: Medicare Other | Admitting: Adult Health

## 2015-04-28 NOTE — Progress Notes (Signed)
Cardiology Office Note   Date:  04/28/2015   ID:  Calvin Rivas, DOB 12/02/1920, MRN [id number]  Error No Show

## 2015-06-09 DEATH — deceased

## 2016-12-04 IMAGING — CT CT HEAD W/O CM
1 of 2 series · 16 of 30 positions shown, 20 images · non-contrast
Comparison: CT scan of August 24, 2013.

CLINICAL DATA: Weakness after fall from wheelchair 2 days ago.

EXAM:
CT HEAD WITHOUT CONTRAST
TECHNIQUE: Contiguous axial images were obtained from the base of the skull
through the vertex without intravenous contrast.

[Series 3: headtrauma 2.4 h60s · axial · 0.43mm/px · z∈[+141,+271]mm · 16 of 60 slices shown, 20 images]
[im 4/60  brain]
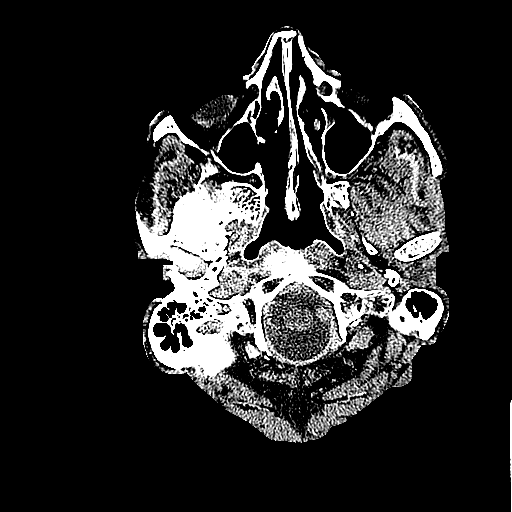
[im 4/60  bone]
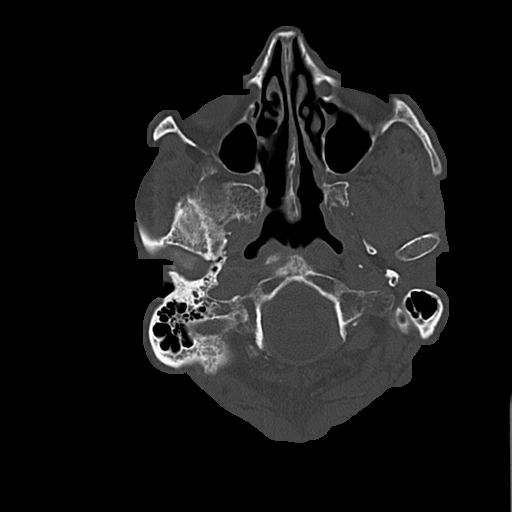
[im 7/60  brain]
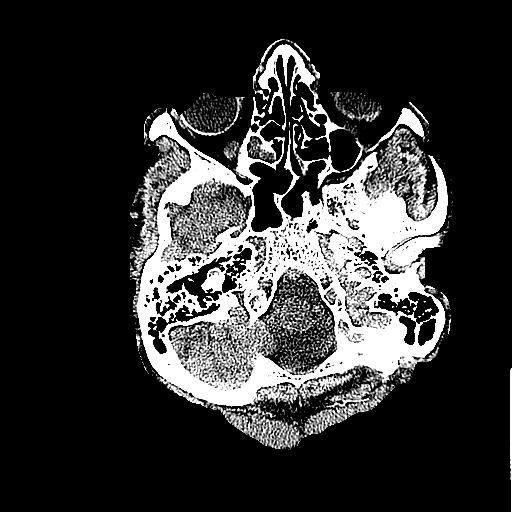
[im 10/60  brain]
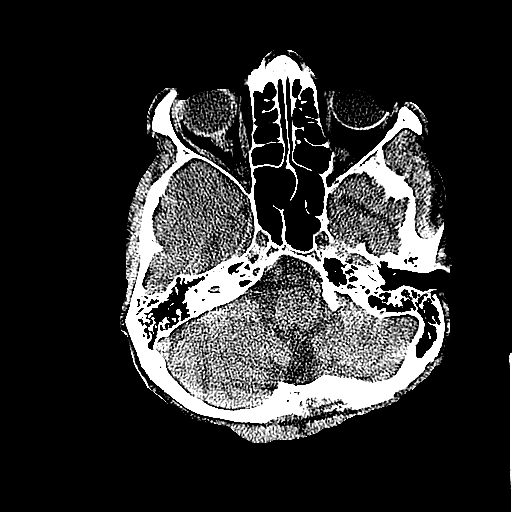
[im 13/60  brain]
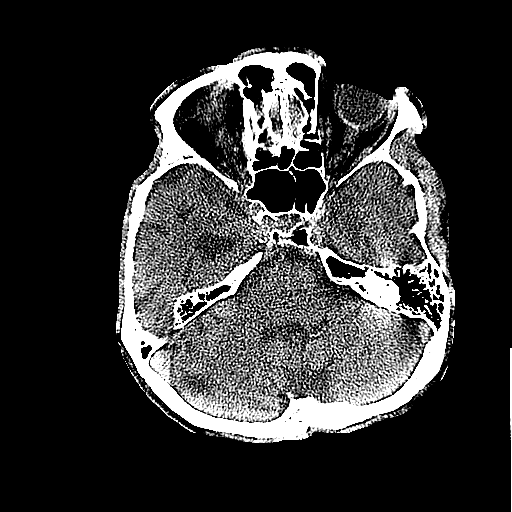
[im 19/60  brain]
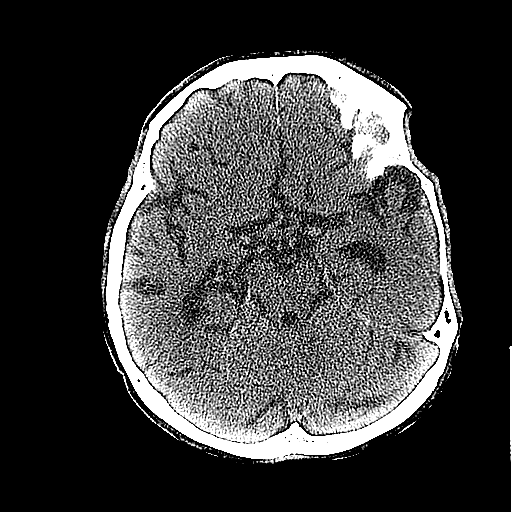
[im 19/60  bone]
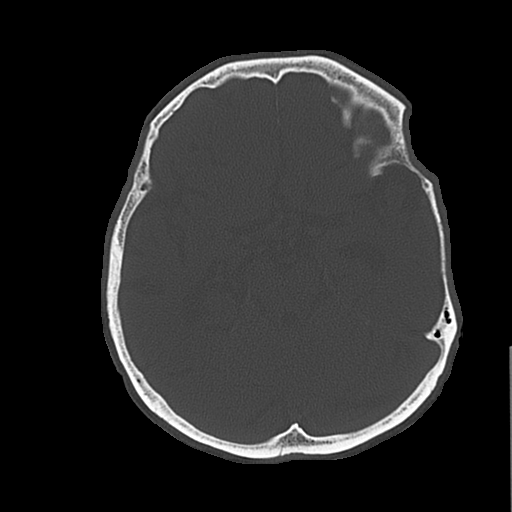
[im 22/60  brain]
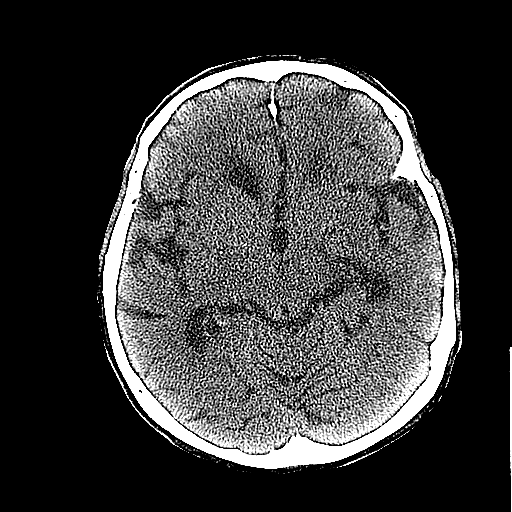
[im 25/60  brain]
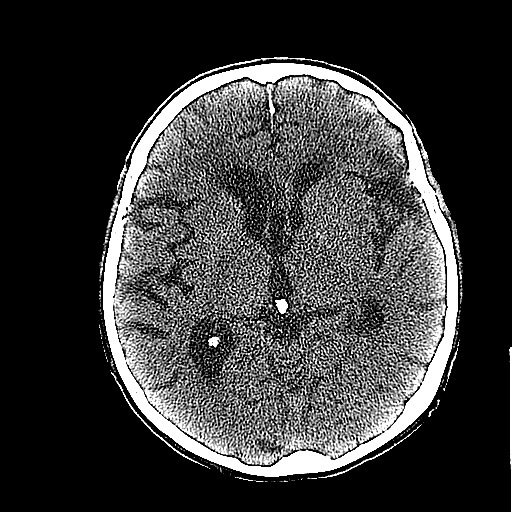
[im 28/60  brain]
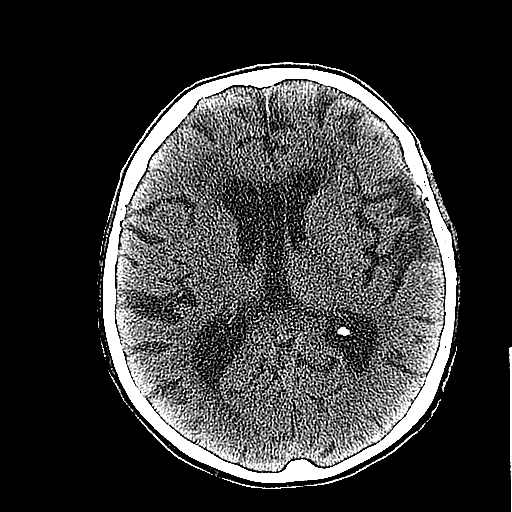
[im 32/60  brain]
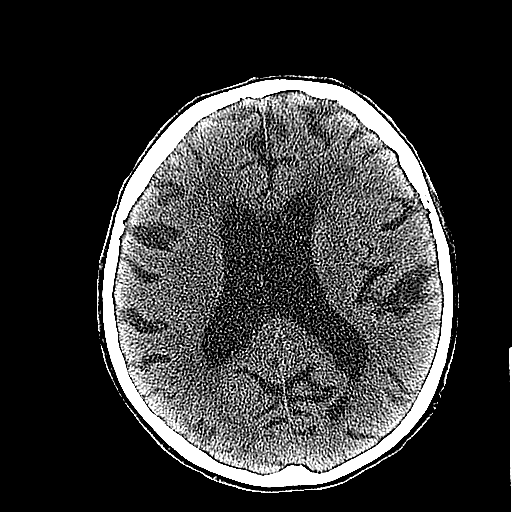
[im 32/60  bone]
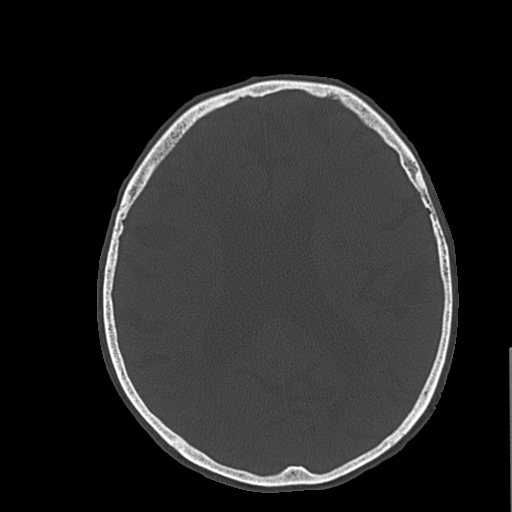
[im 35/60  brain]
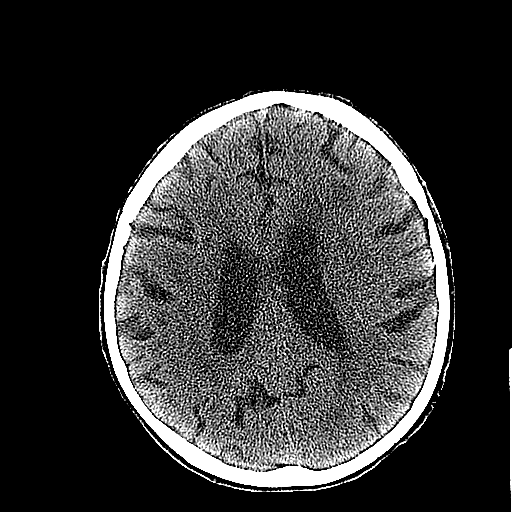
[im 38/60  brain]
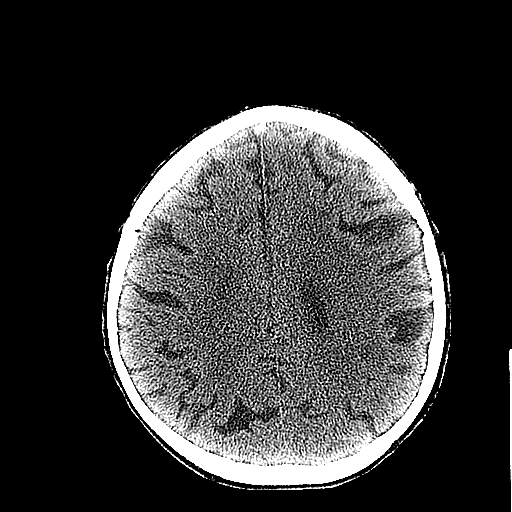
[im 41/60  brain]
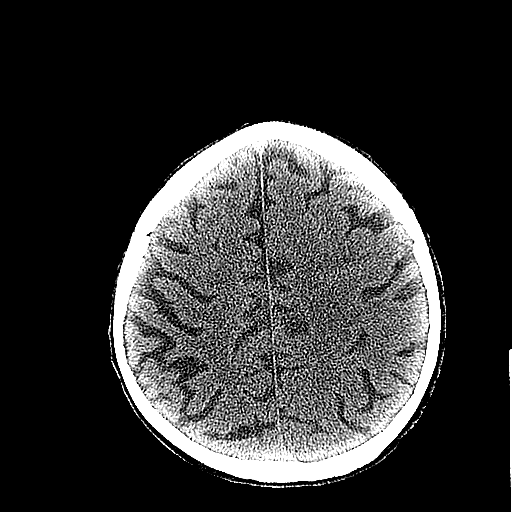
[im 47/60  brain]
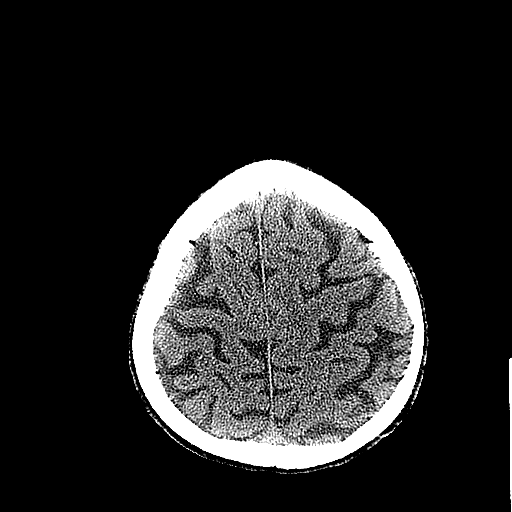
[im 47/60  bone]
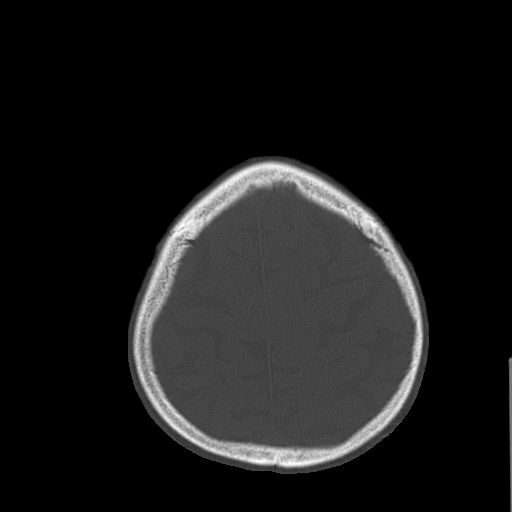
[im 50/60  brain]
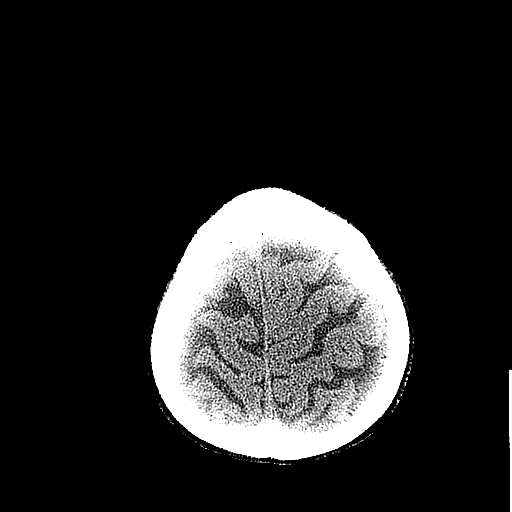
[im 53/60  brain]
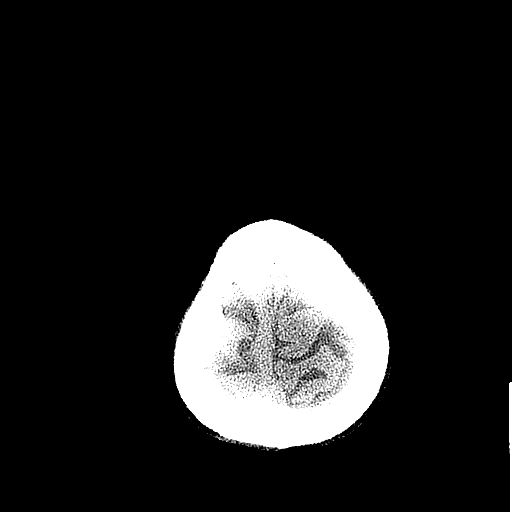
[im 56/60  brain]
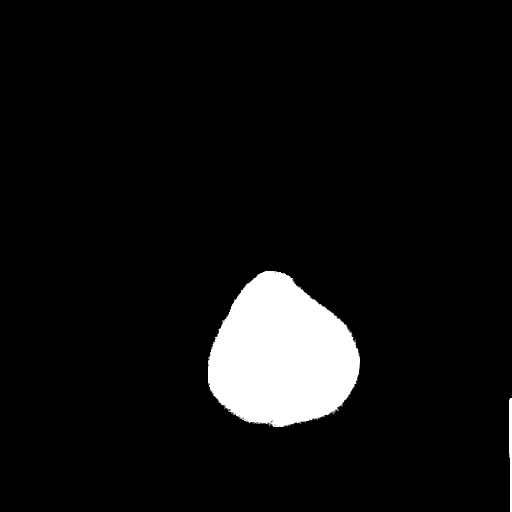

[16 of 30 positions shown; findings below may reference images not displayed]

FINDINGS: Bony calvarium appears intact. Mild diffuse cortical atrophy is
noted. Mild chronic ischemic white matter disease is noted. No mass
effect or midline shift is noted. Ventricular size is within normal
limits. There is no evidence of mass lesion, hemorrhage or acute
infarction. Stable old right basal ganglia infarction is noted.
IMPRESSION: Mild diffuse cortical atrophy. Mild chronic ischemic white matter
disease. No acute intracranial abnormality seen.

## 2016-12-16 IMAGING — US US RENAL
1 series · 14 of 25 positions shown · non-contrast
Comparison: None.

CLINICAL DATA: Acute tubular necrosis.  Hypertension

EXAM:
RENAL / URINARY TRACT ULTRASOUND COMPLETE

[Series 1: us renal · 0.21mm/px · 14 of 42 slices shown]
[im 1/42]
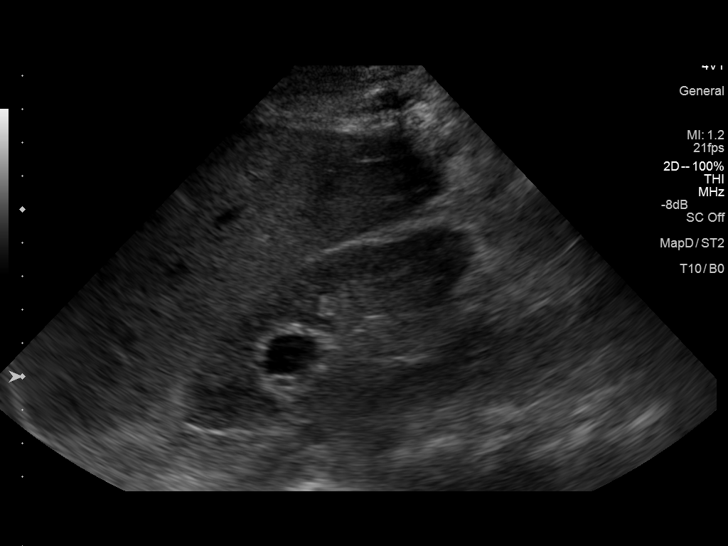
[im 4/42]
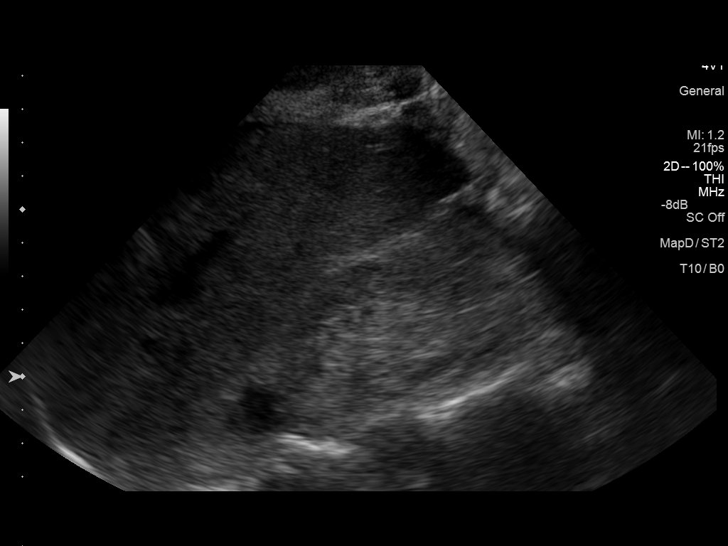
[im 7/42]
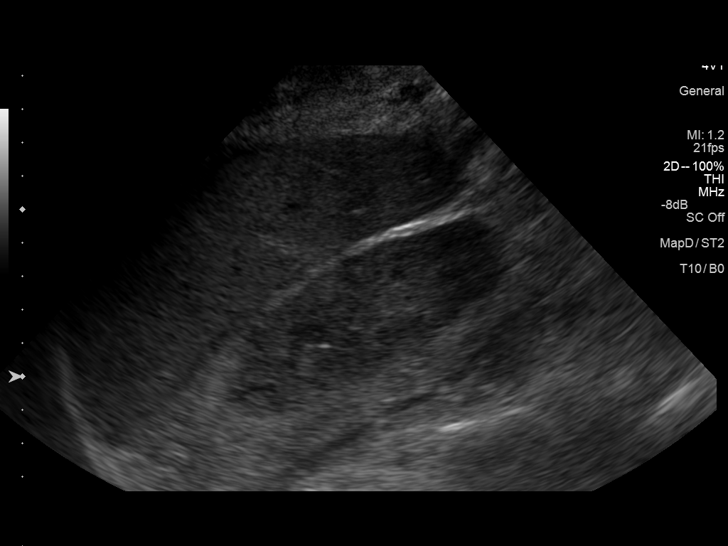
[im 11/42]
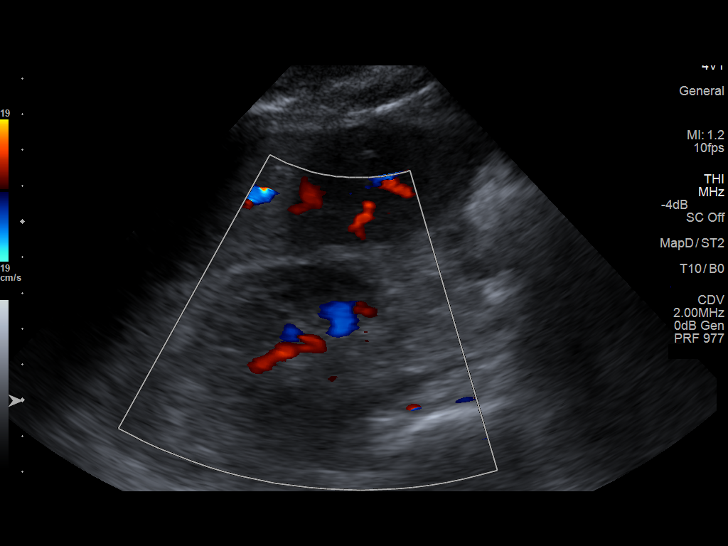
[im 14/42]
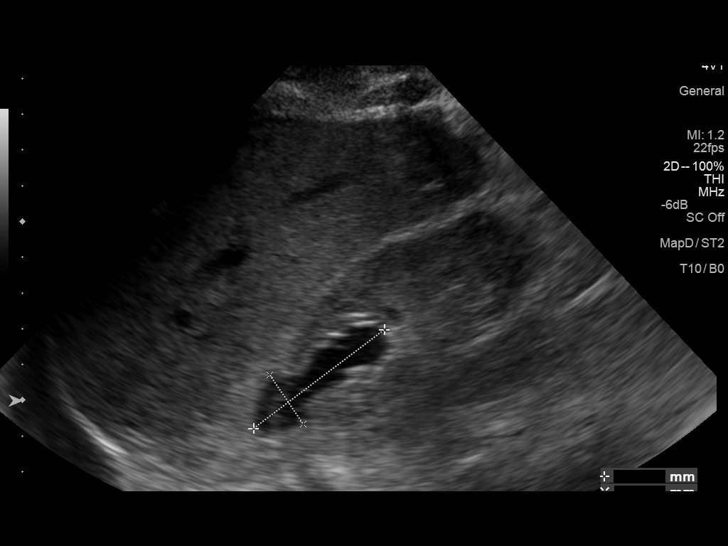
[im 16/42]
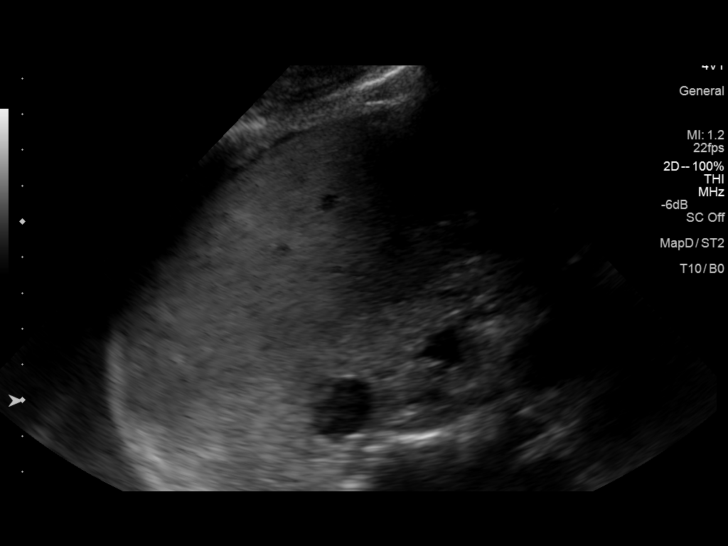
[im 19/42]
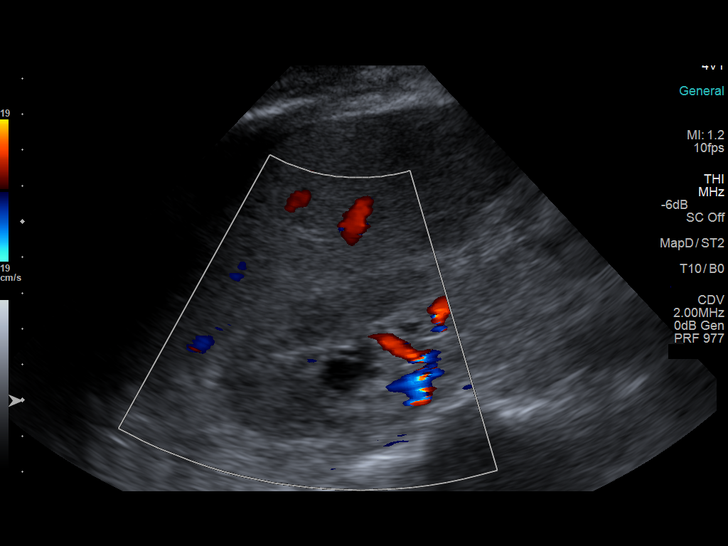
[im 23/42]
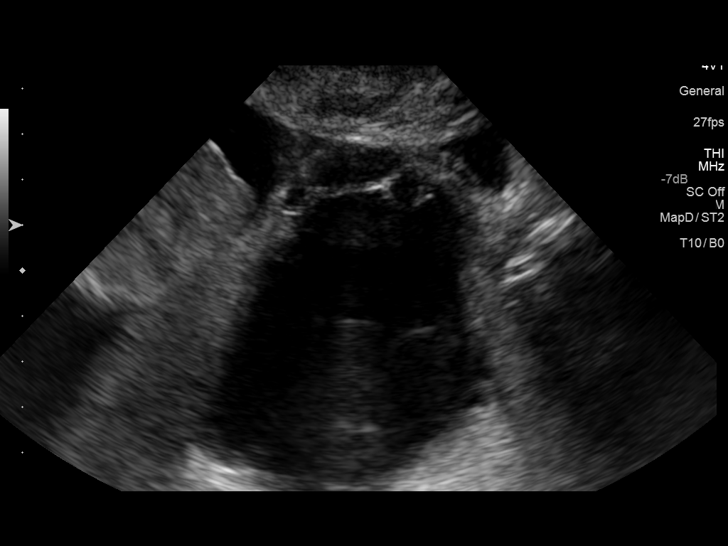
[im 26/42]
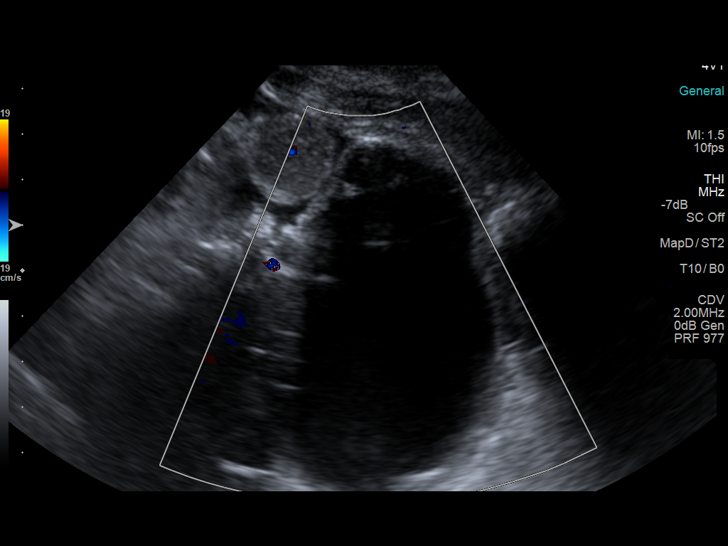
[im 28/42]
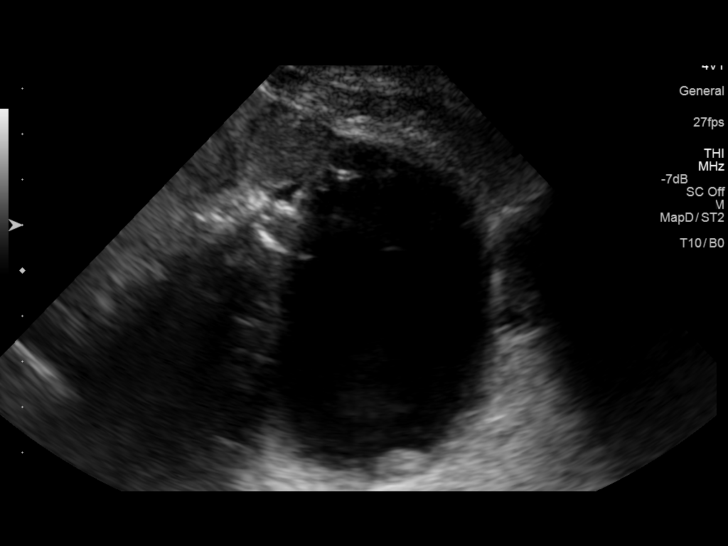
[im 31/42]
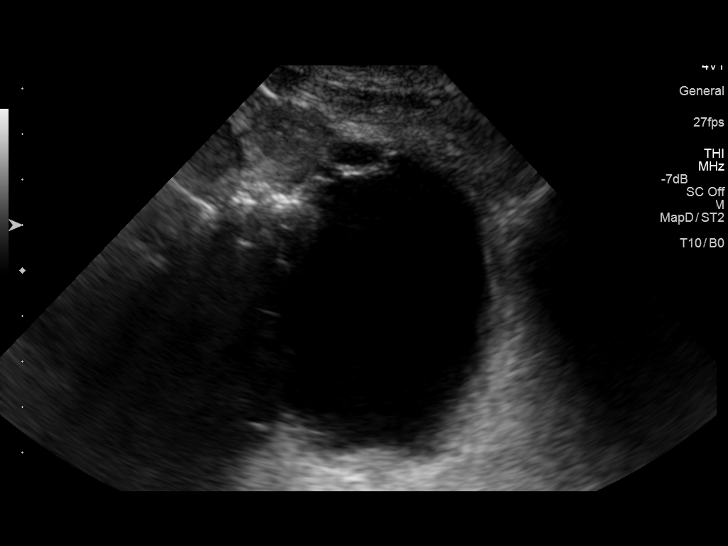
[im 35/42]
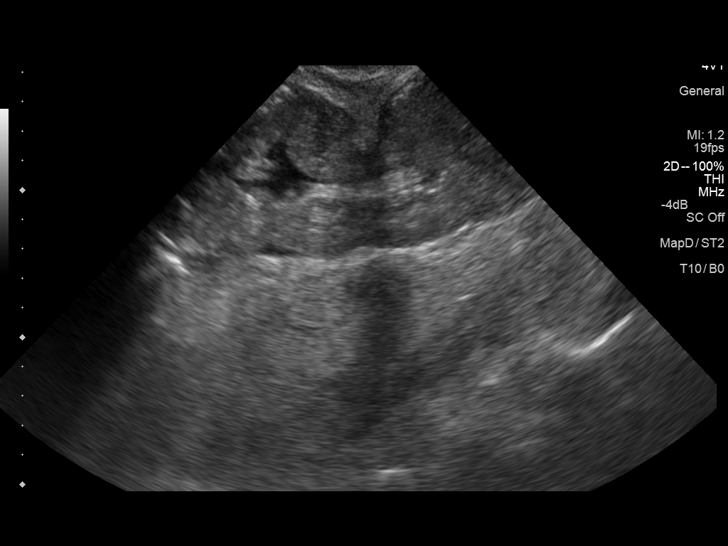
[im 38/42]
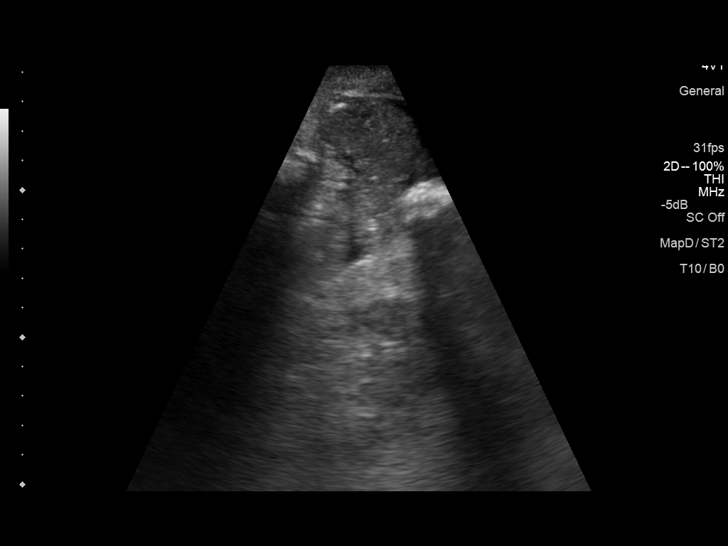
[im 42/42]
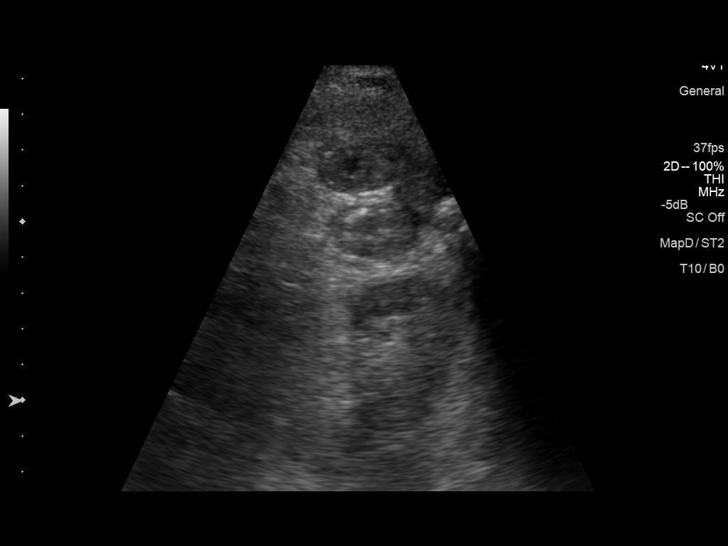

[14 of 25 positions shown; findings below may reference images not displayed]

FINDINGS: Right Kidney:

Length: 10.3 cm. There is a cyst within the upper pole measuring
x 1.7 x 2.0 cm. Similar to previous exam. Increased parenchymal
echogenicity. No mass or hydronephrosis visualized.

Left Kidney:

Length: 11 cm. Increased parenchymal echogenicity. No mass or
hydronephrosis visualized.

Bladder:

Appears normal for degree of bladder distention.
IMPRESSION: 1. No acute findings.
2. Increased parenchymal echogenicity of the kidneys compatible with
medical renal disease.
